# Patient Record
Sex: Female | Born: 1980 | Race: White | Hispanic: No | Marital: Married | Smoking: Never smoker
Health system: Southern US, Community
[De-identification: ages and names within clinical notes are randomized; demographics above are authoritative.]

## PROBLEM LIST (undated history)

## (undated) DIAGNOSIS — J4540 Moderate persistent asthma, uncomplicated: Secondary | ICD-10-CM

## (undated) DIAGNOSIS — J45909 Unspecified asthma, uncomplicated: Secondary | ICD-10-CM

## (undated) DIAGNOSIS — N920 Excessive and frequent menstruation with regular cycle: Secondary | ICD-10-CM

## (undated) DIAGNOSIS — I10 Essential (primary) hypertension: Secondary | ICD-10-CM

## (undated) DIAGNOSIS — Z8759 Personal history of other complications of pregnancy, childbirth and the puerperium: Secondary | ICD-10-CM

## (undated) DIAGNOSIS — R7303 Prediabetes: Secondary | ICD-10-CM

## (undated) DIAGNOSIS — O021 Missed abortion: Secondary | ICD-10-CM

## (undated) HISTORY — PX: WISDOM TOOTH EXTRACTION: SHX21

---

## 1999-01-02 ENCOUNTER — Emergency Department (HOSPITAL_COMMUNITY): Admission: EM | Admit: 1999-01-02 | Discharge: 1999-01-02 | Payer: Self-pay | Admitting: Emergency Medicine

## 1999-01-13 ENCOUNTER — Emergency Department (HOSPITAL_COMMUNITY): Admission: EM | Admit: 1999-01-13 | Discharge: 1999-01-13 | Payer: Self-pay | Admitting: Emergency Medicine

## 2003-04-08 ENCOUNTER — Other Ambulatory Visit: Admission: RE | Admit: 2003-04-08 | Discharge: 2003-04-08 | Payer: Self-pay | Admitting: Obstetrics and Gynecology

## 2004-05-11 ENCOUNTER — Other Ambulatory Visit: Admission: RE | Admit: 2004-05-11 | Discharge: 2004-05-11 | Payer: Self-pay | Admitting: Obstetrics and Gynecology

## 2005-05-15 ENCOUNTER — Other Ambulatory Visit: Admission: RE | Admit: 2005-05-15 | Discharge: 2005-05-15 | Payer: Self-pay | Admitting: Obstetrics and Gynecology

## 2008-03-08 ENCOUNTER — Ambulatory Visit (HOSPITAL_COMMUNITY): Admission: RE | Admit: 2008-03-08 | Discharge: 2008-03-08 | Payer: Self-pay | Admitting: Obstetrics and Gynecology

## 2008-05-21 ENCOUNTER — Emergency Department: Payer: Self-pay | Admitting: Emergency Medicine

## 2008-05-22 ENCOUNTER — Inpatient Hospital Stay (HOSPITAL_COMMUNITY): Admission: AD | Admit: 2008-05-22 | Discharge: 2008-05-25 | Payer: Self-pay | Admitting: Obstetrics and Gynecology

## 2008-05-25 ENCOUNTER — Encounter (INDEPENDENT_AMBULATORY_CARE_PROVIDER_SITE_OTHER): Payer: Self-pay | Admitting: Obstetrics and Gynecology

## 2009-08-08 IMAGING — US US AMNIOCENTESIS
1 series · 8 of 8 positions shown · non-contrast
Comparison: none

OBSTETRICAL ULTRASOUND:
 This ultrasound was performed in The [HOSPITAL], and the [facility] report will be stored to [REDACTED] PACS.

[Series 1: us amniocentesis · 8 of 8 slices shown]
[im 1/8]
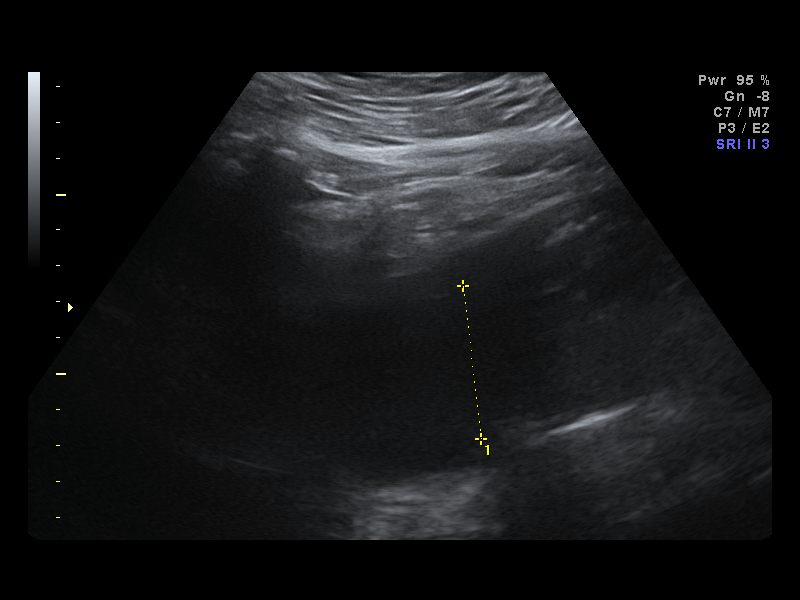
[im 2/8]
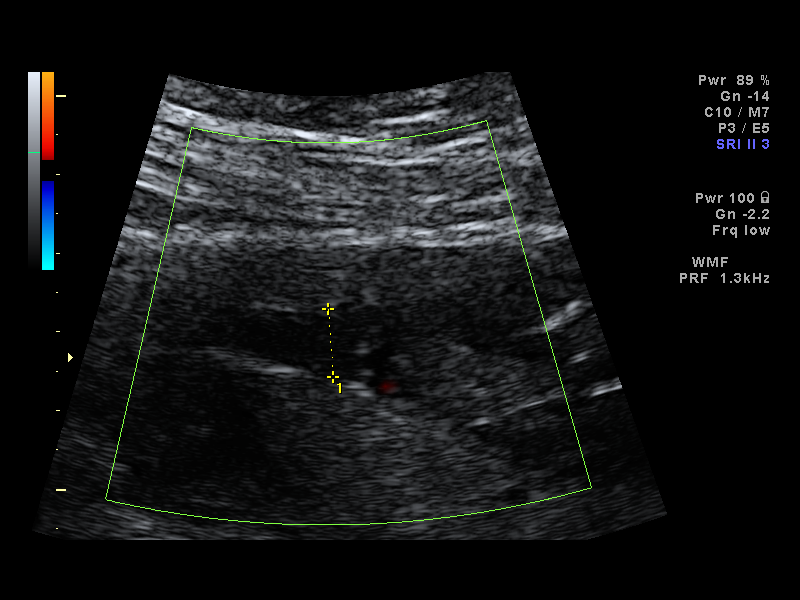
[im 3/8]
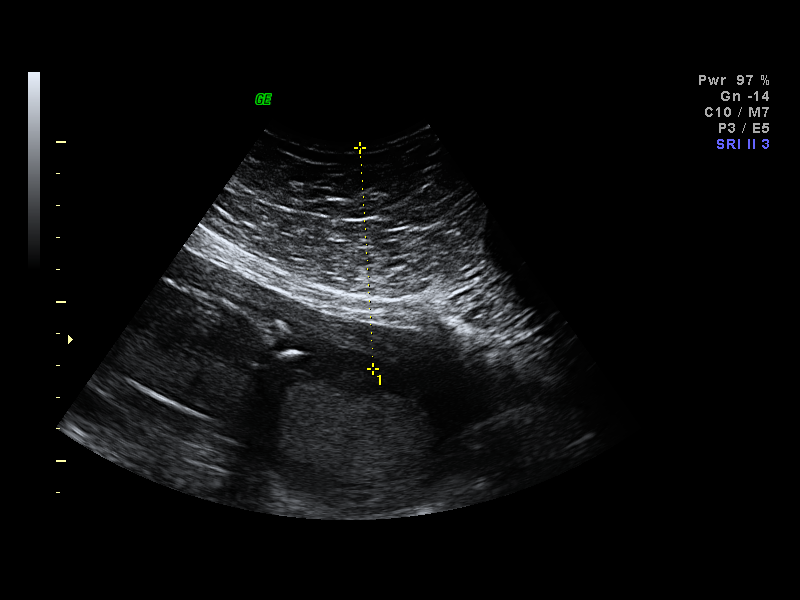
[im 4/8]
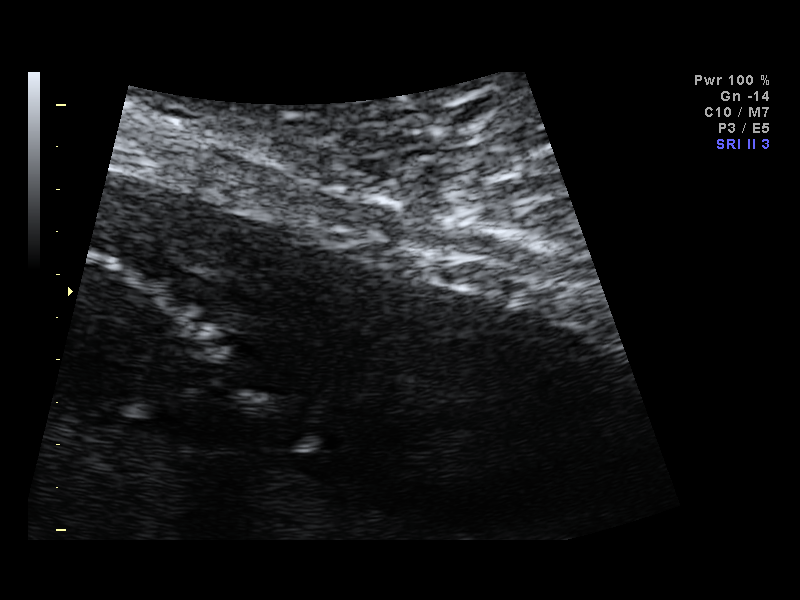
[im 5/8]
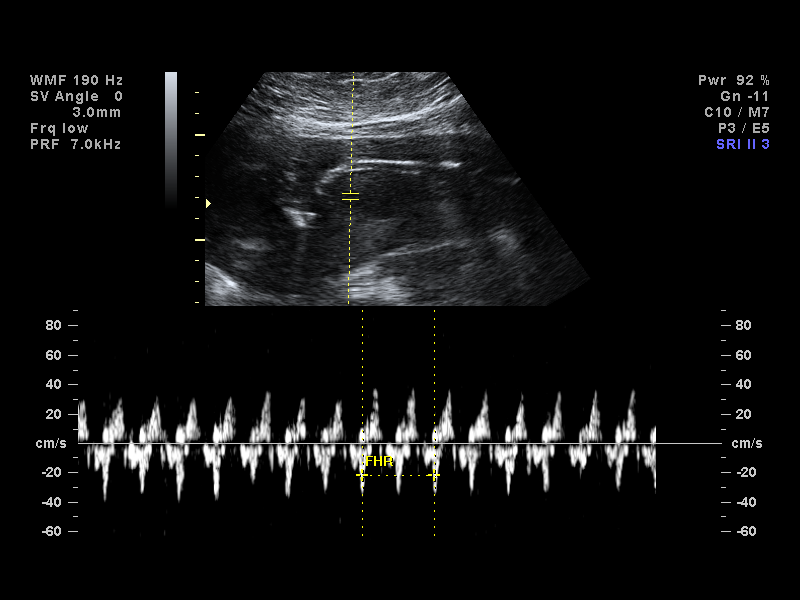
[im 6/8]
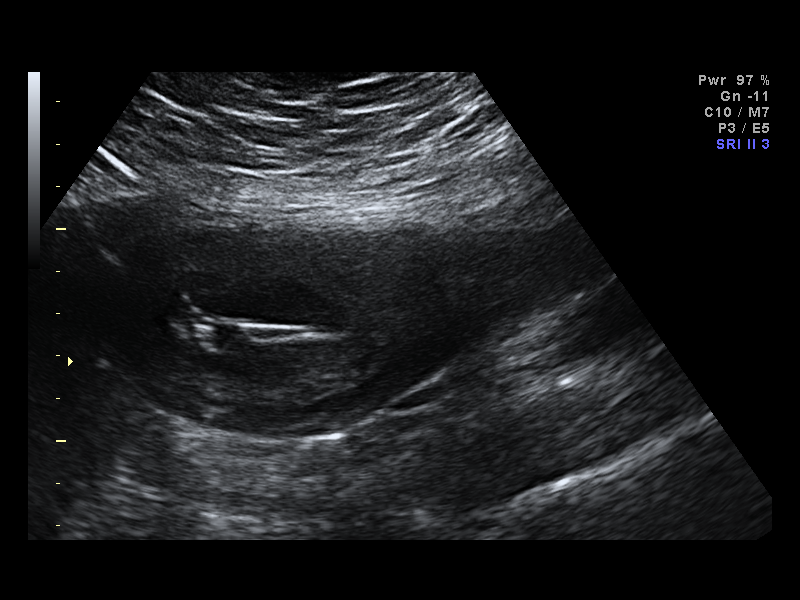
[im 7/8]
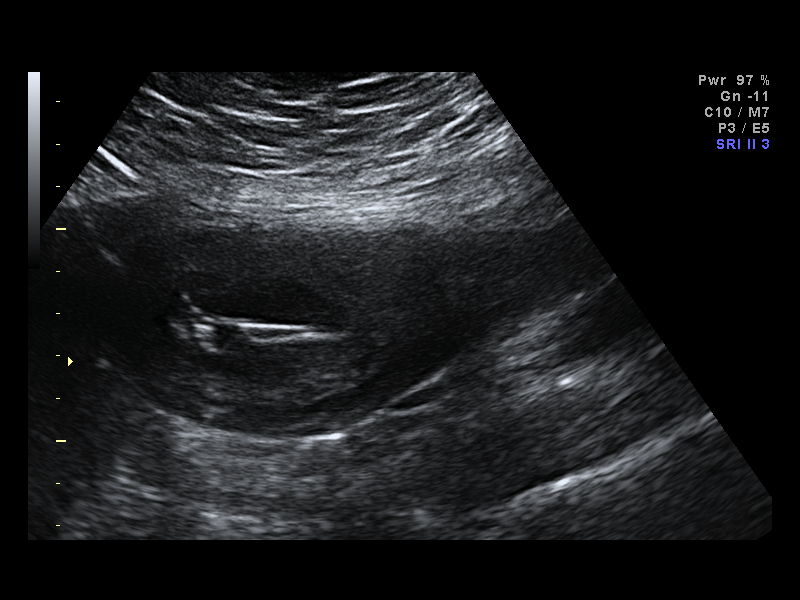
[im 8/8]
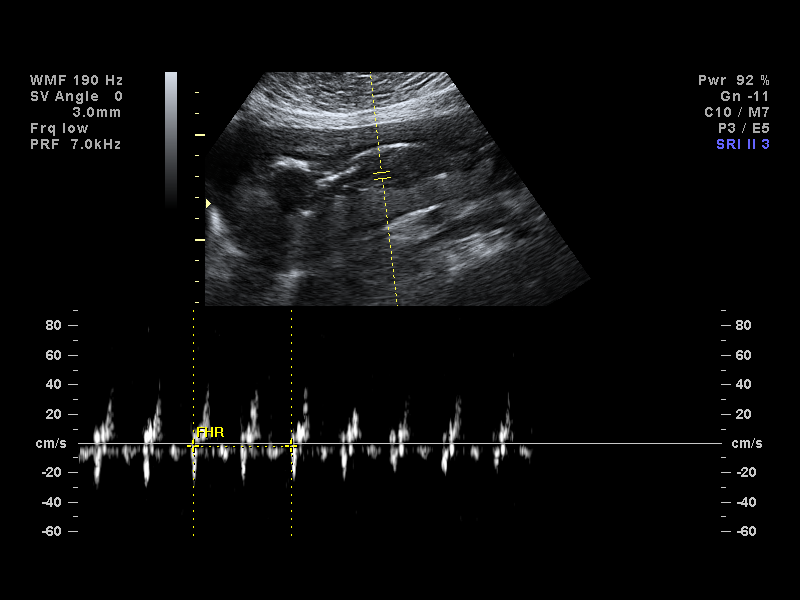

[8 of 8 positions shown; findings below may reference images not displayed]

IMPRESSION: [facility] has also been faxed to the ordering physician.

## 2009-09-08 ENCOUNTER — Inpatient Hospital Stay (HOSPITAL_COMMUNITY): Admission: AD | Admit: 2009-09-08 | Discharge: 2009-09-08 | Payer: Self-pay | Admitting: Obstetrics and Gynecology

## 2009-11-26 HISTORY — PX: CERVICAL CERCLAGE: SHX1329

## 2010-04-04 ENCOUNTER — Ambulatory Visit (HOSPITAL_COMMUNITY): Admission: RE | Admit: 2010-04-04 | Discharge: 2010-04-05 | Payer: Self-pay | Admitting: Obstetrics and Gynecology

## 2010-04-04 HISTORY — PX: CERVICAL CERCLAGE: SHX1329

## 2010-05-15 ENCOUNTER — Ambulatory Visit (HOSPITAL_COMMUNITY): Admission: RE | Admit: 2010-05-15 | Discharge: 2010-05-15 | Payer: Self-pay | Admitting: Obstetrics and Gynecology

## 2010-06-05 ENCOUNTER — Ambulatory Visit (HOSPITAL_COMMUNITY): Admission: RE | Admit: 2010-06-05 | Discharge: 2010-06-05 | Payer: Self-pay | Admitting: Obstetrics and Gynecology

## 2010-07-03 ENCOUNTER — Inpatient Hospital Stay (HOSPITAL_COMMUNITY): Admission: AD | Admit: 2010-07-03 | Discharge: 2010-07-03 | Payer: Self-pay | Admitting: Obstetrics & Gynecology

## 2010-07-04 ENCOUNTER — Inpatient Hospital Stay (HOSPITAL_COMMUNITY): Admission: AD | Admit: 2010-07-04 | Discharge: 2010-07-04 | Payer: Self-pay | Admitting: Obstetrics and Gynecology

## 2010-09-14 ENCOUNTER — Inpatient Hospital Stay (HOSPITAL_COMMUNITY): Admission: AD | Admit: 2010-09-14 | Discharge: 2010-09-14 | Payer: Self-pay | Admitting: Obstetrics and Gynecology

## 2010-09-18 ENCOUNTER — Observation Stay (HOSPITAL_COMMUNITY): Admission: AD | Admit: 2010-09-18 | Discharge: 2010-09-20 | Payer: Self-pay | Admitting: Obstetrics and Gynecology

## 2010-09-19 ENCOUNTER — Ambulatory Visit (HOSPITAL_COMMUNITY): Admission: RE | Admit: 2010-09-19 | Discharge: 2010-09-19 | Payer: Self-pay | Admitting: Obstetrics and Gynecology

## 2010-09-25 ENCOUNTER — Encounter (INDEPENDENT_AMBULATORY_CARE_PROVIDER_SITE_OTHER): Payer: Self-pay | Admitting: Obstetrics and Gynecology

## 2010-09-25 ENCOUNTER — Inpatient Hospital Stay (HOSPITAL_COMMUNITY): Admission: RE | Admit: 2010-09-25 | Discharge: 2010-09-28 | Payer: Self-pay | Admitting: Obstetrics and Gynecology

## 2010-09-28 ENCOUNTER — Encounter: Admission: RE | Admit: 2010-09-28 | Discharge: 2010-10-06 | Payer: Self-pay | Admitting: Obstetrics and Gynecology

## 2011-02-06 LAB — CBC
HCT: 30.1 % — ABNORMAL LOW (ref 36.0–46.0)
Hemoglobin: 10.4 g/dL — ABNORMAL LOW (ref 12.0–15.0)
MCH: 30.9 pg (ref 26.0–34.0)
MCHC: 34.5 g/dL (ref 30.0–36.0)
MCV: 89.8 fL (ref 78.0–100.0)
Platelets: 248 10*3/uL (ref 150–400)
RBC: 3.35 MIL/uL — ABNORMAL LOW (ref 3.87–5.11)
RDW: 15.5 % (ref 11.5–15.5)
WBC: 12.5 10*3/uL — ABNORMAL HIGH (ref 4.0–10.5)

## 2011-02-07 LAB — URIC ACID
Uric Acid, Serum: 4.7 mg/dL (ref 2.4–7.0)
Uric Acid, Serum: 4.8 mg/dL (ref 2.4–7.0)
Uric Acid, Serum: 5 mg/dL (ref 2.4–7.0)

## 2011-02-07 LAB — URINALYSIS, MICROSCOPIC ONLY
Glucose, UA: NEGATIVE mg/dL
Specific Gravity, Urine: 1.01 (ref 1.005–1.030)
pH: 7 (ref 5.0–8.0)

## 2011-02-07 LAB — URINALYSIS, ROUTINE W REFLEX MICROSCOPIC
Nitrite: NEGATIVE
Specific Gravity, Urine: 1.02 (ref 1.005–1.030)
Urobilinogen, UA: 0.2 mg/dL (ref 0.0–1.0)

## 2011-02-07 LAB — COMPREHENSIVE METABOLIC PANEL
AST: 24 U/L (ref 0–37)
Albumin: 2.7 g/dL — ABNORMAL LOW (ref 3.5–5.2)
Albumin: 2.9 g/dL — ABNORMAL LOW (ref 3.5–5.2)
Alkaline Phosphatase: 89 U/L (ref 39–117)
Alkaline Phosphatase: 99 U/L (ref 39–117)
BUN: 1 mg/dL — ABNORMAL LOW (ref 6–23)
BUN: 3 mg/dL — ABNORMAL LOW (ref 6–23)
BUN: 4 mg/dL — ABNORMAL LOW (ref 6–23)
CO2: 23 mEq/L (ref 19–32)
Calcium: 9.4 mg/dL (ref 8.4–10.5)
Chloride: 101 mEq/L (ref 96–112)
Chloride: 104 mEq/L (ref 96–112)
Creatinine, Ser: 0.43 mg/dL (ref 0.4–1.2)
GFR calc Af Amer: >60 mL/min (ref >60)
GFR calc non Af Amer: >60 mL/min (ref >60)
Potassium: 3.7 mEq/L (ref 3.5–5.1)
Potassium: 3.9 mEq/L (ref 3.5–5.1)
Sodium: 133 mEq/L — ABNORMAL LOW (ref 135–145)
Total Bilirubin: 0.3 mg/dL (ref 0.3–1.2)
Total Bilirubin: 0.4 mg/dL (ref 0.3–1.2)
Total Protein: 6.1 g/dL (ref 6.0–8.3)

## 2011-02-07 LAB — CBC
Hemoglobin: 11.9 g/dL — ABNORMAL LOW (ref 12.0–15.0)
MCH: 30.4 pg (ref 26.0–34.0)
MCV: 89.1 fL (ref 78.0–100.0)
MCV: 89.3 fL (ref 78.0–100.0)
MCV: 89.6 fL (ref 78.0–100.0)
Platelets: 289 10*3/uL (ref 150–400)
Platelets: 317 10*3/uL (ref 150–400)
RBC: 3.93 MIL/uL (ref 3.87–5.11)
RBC: 4.11 MIL/uL (ref 3.87–5.11)
RDW: 15.3 % (ref 11.5–15.5)
WBC: 10.2 10*3/uL (ref 4.0–10.5)
WBC: 11.7 10*3/uL — ABNORMAL HIGH (ref 4.0–10.5)

## 2011-02-07 LAB — SYPHILIS: RPR W/REFLEX TO RPR TITER AND TREPONEMAL ANTIBODIES, TRADITIONAL SCREENING AND DIAGNOSIS ALGORITHM: RPR Ser Ql: NONREACTIVE

## 2011-02-07 LAB — URINE MICROSCOPIC-ADD ON

## 2011-02-13 LAB — CBC
HCT: 34.2 % — ABNORMAL LOW (ref 36.0–46.0)
Hemoglobin: 11.8 g/dL — ABNORMAL LOW (ref 12.0–15.0)
Platelets: 305 10*3/uL (ref 150–400)
RBC: 3.78 MIL/uL — ABNORMAL LOW (ref 3.87–5.11)
RDW: 13.2 % (ref 11.5–15.5)
RDW: 13.4 % (ref 11.5–15.5)

## 2011-02-13 LAB — URINALYSIS, ROUTINE W REFLEX MICROSCOPIC
Bilirubin Urine: NEGATIVE
Nitrite: NEGATIVE
Protein, ur: NEGATIVE mg/dL
Specific Gravity, Urine: 1.01 (ref 1.005–1.030)
Urobilinogen, UA: 0.2 mg/dL (ref 0.0–1.0)

## 2011-02-13 LAB — TYPE AND SCREEN

## 2011-02-13 LAB — URINE MICROSCOPIC-ADD ON

## 2011-03-01 LAB — CBC
Hemoglobin: 12.6 g/dL (ref 12.0–15.0)
MCHC: 33.7 g/dL (ref 30.0–36.0)
MCV: 92.9 fL (ref 78.0–100.0)
RDW: 13.4 % (ref 11.5–15.5)

## 2011-04-10 NOTE — Consult Note (Signed)
NAME:  Summer Thomas, Summer Thomas             ACCOUNT NO.:  [id number]   MEDICAL RECORD NO.:  [id number]          PATIENT TYPE:  INP   LOCATION:  9373                          FACILITY:  [facility]   PHYSICIAN:  Toma Copier, MD           DATE OF BIRTH:  [birth year]   DATE OF CONSULTATION:  DATE OF DISCHARGE:                                 CONSULTATION   CONSULTATION REQUESTED BY:  Randye Lobo, M.D.   REASON FOR CONSULTATION:  Evaluation and recommendations regarding  cervical insufficiency.   The recommendations are already placed on the chart, but this is a more  detailed consultation report.  The patient was evaluated on May 22, 2008 at 4 p.m.   CLINICAL PRESENTATION:  The patient is a 30 year old G 1, P 0 at 16  weeks and 4 days who was admitted to [facility] at [facility]  secondary to prolapsing membranes into the cervix and vagina.  After  transfer and evaluation by Dr. Edward Jolly revealed that there are amniotic  membranes into the vagina, but are difficult to reduce further, and  there are no palpable fetal parts.  This finding in conjunction with the  ultrasound findings that reveal no obvious cervix and a cervical funnel  width of 4.7 cm with membranes funneling into the vagina are consistent.  The ultrasound does confirm an estimated fetal weight of 16 weeks and 4  days and a normal fetal heart rate.   The patient reports that this presentation occurred the prior evening  and was feeling pressure as if to have a bowel movement.  The patient  also denies any cramping, pain or leakage.  No vaginal bleeding or any  abnormal discharge.  Denies any fevers, chills, dysuria or any recent  illnesses.  She also denies any surgical procedures to the cervix and  has received prenatal care.  Since her hospitalization, there have been  no changes to any of these reported symptoms.   PAST MEDICAL HISTORY:  Notable for wisdom teeth extractions.   ALLERGIES:  INCLUDE PENICILLIN, WHICH CAUSES  HIVES.   SOCIAL HISTORY:  She denies any alcohol, tobacco or drugs of abuse.   OBSTETRICAL HISTORY:  She is a G 1.   GYNECOLOGICAL HISTORY:  Denies any abnormal Paps or STDs.   PHYSICAL EXAMINATION:  Reveals that she is afebrile.  VITAL SIGNS:  Stable.  HEART RATE:  Fetal heart tones are documented in the 160s as mentioned  previously.  GENERAL:  She is alert and oriented and concerned, but in no acute  distress.  RESPIRATORY EXAM:  Clear to auscultation bilaterally.  CARDIOVASCULAR EXAM:  Reveals a regular rate and rhythm with no notable  murmurs  ABDOMEN:  Reveals a gravid, nontender uterus.  EXTREMITIES:  No clubbing or cyanosis.  PELVIC:  Exam deferred as it was already previously performed by Dr.  Edward Jolly and based on her speculum examination, membranes were protruding  to the level of the hymen, there was no evidence of any fetal parts nor  any opaque amniotic fluid, and the membranes were somewhat reducible but  unable  to evaluate the cervix.   LABORATORY EVALUATION:  Reveals a hemoglobin of 12.4, a white count of  10.4 and a platelet count of 297,000.  All other laboratory evaluations  appear to be within normal limits.  As mentioned, the ultrasound finding  reveals 15 week and 4 day intrauterine pregnancy, breech presentation at  173 grams and the cervical dilatation of 4.7 cm.   ASSESSMENT:  A 15 week and 4 day intrauterine pregnancy with cervical  insufficiency and advanced cervical dilatation.  There is no clinical  evidence of chorionitis at this time and the patient is currently  receiving Clindamycin therapy.  She is in Trendelenburg position at the  time of evaluation.   PLAN:  The consultation included an extensive discussion regarding this  clinical picture.  We discussed three options:  1. Expectant management.  2. Heroic cervical cerclage procedure.  3. Elective termination of pregnancy.   I reviewed two recent studies, one published in the American  Journal of  Obstetrics and Gynecology by Dr. Alan Mulder in which similar presentations  were reviewed, women presenting with dilated cervix from 14 to 26 weeks'  gestation, and in this study, they had over 152 patients that were  analyzed with a cerclage placement compared to 86 women in the expectant  management group.  However, only 3 patients had cervix that were dilated  to nearly 5 cm.  Three to four patients had cervix that were dilated to  nearly 5 cm, and of note, the interval from presentation to delivery in  all cases of cerclage was 1.8 weeks with the 95% confidence interval  being 2.45 weeks, and those women who were dilated 2 to 4 cm, their  interval from presentation to delivery was 1.3 cm with the upper limit  of the confidence interval being 2.3 weeks.  Further discussion in their  publication revealed that in most women when a cervix is dilated greater  than 4 cm, the cerclage may no longer be a viable option.  This was  consistent with the findings published in the European Journal of  Obstetrics and Gynecology by [employer], in which their series of 161  patients evaluated form 1989 to 2005 with second trimester cerclages,  had findings where the average amount of interval from presentation to  delivery was 41 days in the cerclage group and 3 days in the expectant  management group.  In the [place] studies group, the average birth weight  was 1340 grams versus 750 grams.  None of these studies addressed the  neonatal morbidities associated with these preterm births, but only  focused on live birth findings.  I did offer to provide one of these  studies for the patient to keep and review, but she declined.   Additionally, we discussed the likelihood of an intraamniotic infection  ranging anywhere from 10 to up to 50% in the setting when the cervix is  dilated greater than 2 cm.  Also discussed that coronitis occurs in over  25% when there is a nonelective cerclage and the  risk for an iatrogenic  rupture of membranes is over 50% in these settings with advanced  cervical dilatations.   The alternatives that we offered th patient again were an elective  induction, which was declined.  Next option was evaluating for  intraamniotic infection via amniocentesis with the associated risk of  rupture of membranes and the challenge given the funneling of the  membrane, and this was also declined.  Cervical cerclage procedure,  given the above mentioned discussion, was deferred at this time by the  patient, and expected management with some IV antibiotics was elected as  the option to proceed with at this time.   I was available to address all their questions related to each of these  options, and reassured them that any decision they elected at this time  would be based on a personal choice, but we would be glad to assist in  any or all of these options.  With regards to future pregnancies  irrespective of current pregnancy outcome, I recommended that she would  need a prophylactic cerclage placed at 13 to 14 weeks' gestation given  this presentation.   A total of 1 hour was spent on this consultation of which 45 minutes  were face to face time.  The patient and her husband felt very satisfied  with our discussion and the options available to them, and were  comfortable with their decision to continue expected management.   Thank you very much for allowing me to participate in the care of Mrs.  Lezama, and if you have any further questions or if plans change,  please contact me and I will be glad to assist.      Toma Copier, MD  Electronically Signed     SJ/MEDQ  D:  05/23/2008  T:  05/23/2008  Job:  [id number]

## 2011-04-13 NOTE — Discharge Summary (Signed)
[name], [name]             ACCOUNT NO.:  [id number]   MEDICAL RECORD NO.:  [id number]          PATIENT TYPE:  INP   LOCATION:  9373                          [facility]ACILITY:  [facility]   PHYSICIAN:  Carrington Clamp, M.D. DATE OF BIRTH:  [birth year]   DATE OF ADMISSION:  05/22/2008  DATE OF DISCHARGE:  05/25/2008                               DISCHARGE SUMMARY   FINAL DIAGNOSES:  1. Intrauterine pregnancy at 16-4/7th weeks' gestation.  2. 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  3. Advanced dilation.  4. Spontaneous rupture of membranes.   Procedure and delivery performed by Dr. Carrington Clamp.   COMPLICATIONS:  None.   This 30 year old G1, P0 presents at 16-4/7th weeks' gestation on to the  [facility], arriving from [facility] with prolapsing of  membranes into the vagina.  The patient was evaluated by Dr. Edward Jolly and  the amniotic membranes were felt difficult to reduce further.  The  patient also had some advanced cervical dilation.  Dr. Ander Slade, maternal  fetal medicine specialist, was consulted on the patient.  The cervix was  already about 4.7.  The patient's cervix was already having some  funneling.  Discussion was held with the patient regarding expectant  management versus rescue cervical cerclage.  The patient decided to have  amniocentesis performed to check status of pregnancy for karyotyping.  Unfortunately, the patient was also started on antibiotics during this  time.  On May 23, 2008, the patient did have amniocentesis, but on May 24, 2008, spontaneous rupture of membranes occurred.  The patient  underwent induction for the spontaneous rupture.  She received Cytotec  and Stadol.  Delivery was performed by Carrington Clamp, M.D.,  unsure  if the patient decided on chromosomal studies on the baby.  The patient  was felt ready for discharge on May 25, 2008.  She was sent home on a  regular diet.  Told to decrease her activities.  She  was given Motrin  800 mg every 8 hours as needed for pain, told she could use Ambien 10 mg  as needed to help with sleep.  Precautions were reviewed with the  patient.   LABORATORY DATA:  On discharge, the patient had a hemoglobin of 12.3,  white blood cell count of 13.4, and platelets of 264,000.  Group B strep  was negative.  Gonorrhea and chlamydia were negative.      Summer Thomas, P.A.-C.      Carrington Clamp, M.D.  Electronically Signed    MB/MEDQ  D:  06/23/2008  T:  06/24/2008  Job:  [id number]

## 2011-08-23 LAB — MISCELLANEOUS TEST

## 2011-08-23 LAB — CBC
HCT: 35.2 — ABNORMAL LOW
HCT: 35.7 — ABNORMAL LOW
HCT: 36
Hemoglobin: 12.1
Hemoglobin: 12.3
MCHC: 34.8
MCV: 91.6
MCV: 92.5
Platelets: 249
Platelets: 264
Platelets: 297
RBC: 3.91
RDW: 12.9
RDW: 13.3
WBC: 10.4
WBC: 10.9 — ABNORMAL HIGH
WBC: 13.4 — ABNORMAL HIGH

## 2011-08-23 LAB — GLUCOSE, SEROUS FLUID: Glucose, Fluid: 10

## 2011-08-23 LAB — DIFFERENTIAL
Basophils Absolute: 0
Basophils Absolute: 0
Eosinophils Absolute: 0
Eosinophils Relative: 0
Eosinophils Relative: 0
Eosinophils Relative: 1
Eosinophils Relative: 1
Lymphocytes Relative: 10 — ABNORMAL LOW
Lymphocytes Relative: 15
Lymphocytes Relative: 16
Lymphocytes Relative: 19
Lymphs Abs: 1.1
Lymphs Abs: 2
Lymphs Abs: 2.1
Monocytes Absolute: 0.4
Monocytes Absolute: 0.8
Neutro Abs: 9 — ABNORMAL HIGH
Neutro Abs: 9.4 — ABNORMAL HIGH
Neutrophils Relative %: 76

## 2011-08-23 LAB — URINALYSIS, ROUTINE W REFLEX MICROSCOPIC
Bilirubin Urine: NEGATIVE
Glucose, UA: NEGATIVE
Ketones, ur: 15 — AB
Nitrite: NEGATIVE
Specific Gravity, Urine: 1.01
pH: 7

## 2011-08-23 LAB — COMPREHENSIVE METABOLIC PANEL
AST: 16
Albumin: 2.7 — ABNORMAL LOW
BUN: 4 — ABNORMAL LOW
Calcium: 8.9
Creatinine, Ser: 0.39 — ABNORMAL LOW
GFR calc Af Amer: >60
Total Protein: 6.3

## 2011-08-23 LAB — CULTURE, BETA STREP (GROUP B ONLY)

## 2011-08-23 LAB — BODY FLUID CULTURE

## 2011-08-23 LAB — ABO/RH: ABO/RH(D): A POS

## 2011-08-23 LAB — TYPE AND SCREEN: ABO/RH(D): A POS

## 2011-08-23 LAB — WET PREP, GENITAL
Trich, Wet Prep: NONE SEEN
Yeast Wet Prep HPF POC: NONE SEEN

## 2011-08-23 LAB — GC/CHLAMYDIA PROBE AMP, GENITAL: Chlamydia, DNA Probe: NEGATIVE

## 2014-03-05 ENCOUNTER — Other Ambulatory Visit: Payer: Self-pay | Admitting: Obstetrics and Gynecology

## 2014-06-25 ENCOUNTER — Other Ambulatory Visit: Payer: Self-pay | Admitting: Obstetrics and Gynecology

## 2014-07-08 LAB — OB RESULTS CONSOLE RPR: RPR: NONREACTIVE

## 2014-07-08 LAB — OB RESULTS CONSOLE HEPATITIS B SURFACE ANTIGEN: Hepatitis B Surface Ag: NEGATIVE

## 2014-07-08 LAB — OB RESULTS CONSOLE ABO/RH: RH TYPE: POSITIVE

## 2014-07-08 LAB — OB RESULTS CONSOLE HIV ANTIBODY (ROUTINE TESTING): HIV: NONREACTIVE

## 2014-07-08 LAB — OB RESULTS CONSOLE ANTIBODY SCREEN: Antibody Screen: NEGATIVE

## 2014-07-08 LAB — OB RESULTS CONSOLE RUBELLA ANTIBODY, IGM: Rubella: IMMUNE

## 2014-08-03 ENCOUNTER — Encounter (HOSPITAL_COMMUNITY): Payer: Self-pay | Admitting: *Deleted

## 2014-08-03 ENCOUNTER — Other Ambulatory Visit (HOSPITAL_COMMUNITY): Payer: Self-pay | Admitting: Obstetrics and Gynecology

## 2014-08-03 DIAGNOSIS — N883 Incompetence of cervix uteri: Secondary | ICD-10-CM

## 2014-08-04 ENCOUNTER — Encounter (HOSPITAL_COMMUNITY): Payer: Self-pay | Admitting: Pharmacist

## 2014-08-13 ENCOUNTER — Ambulatory Visit (HOSPITAL_COMMUNITY)
Admission: RE | Admit: 2014-08-13 | Discharge: 2014-08-13 | Disposition: A | Discharge status: Home / Self Care | Payer: BC Managed Care – PPO | Source: Ambulatory Visit | Attending: Obstetrics and Gynecology | Admitting: Obstetrics and Gynecology

## 2014-08-13 DIAGNOSIS — N883 Incompetence of cervix uteri: Secondary | ICD-10-CM

## 2014-08-13 DIAGNOSIS — O3430 Maternal care for cervical incompetence, unspecified trimester: Secondary | ICD-10-CM | POA: Diagnosis not present

## 2014-08-15 ENCOUNTER — Other Ambulatory Visit: Payer: Self-pay | Admitting: Obstetrics and Gynecology

## 2014-08-15 NOTE — H&P (Signed)
33 y.o. yo G3P1 at 37 4/7 weeks with history of pregnacy loss at 16 weeks and successful pregnancy after cervical cerclage in last pregnancy despite funnelling of cervix. On Friday pt had [place] that showed 3.5 cm cervix with NO funnelling.  Pt had declined testing for downs for AMA.  Past Medical History  Diagnosis Date  . Hypertension 2011-2013    no meds currently   Past Surgical History  Procedure Laterality Date  . Cervical cerclage    . Cesarean section      History   Social History  . Marital Status: Married    Spouse Name: N/A    Number of Children: N/A  . Years of Education: N/A   Occupational History  . Not on file.   Social History Main Topics  . Smoking status: Never Smoker   . Smokeless tobacco: Not on file  . Alcohol Use: No  . Drug Use: No  . Sexual Activity: Not on file   Other Topics Concern  . Not on file   Social History Narrative  . No narrative on file    No current facility-administered medications on file prior to encounter.   No current outpatient prescriptions on file prior to encounter.    Allergies  Allergen Reactions  . Penicillins Rash    Childhood rxn    @  Lungs: clear to ascultation Cor:  RRR Abdomen:  soft, nontender, nondistended. Ex:  no cords, erythema Pelvic:  NEFG, cervix closed and no abnormalites.  A:  Third pregnancy with hx of incompetent cerclage; now 13 4/7 weeks.   P:  For transvaginal cerclage- [place] planned with 2 stitches.   All risks, benefits and alternatives d/w patient and she desires to proceed.  Patient will receive preop antibiotics and SCDs during the operation.     Reshunda Strider A

## 2014-08-16 ENCOUNTER — Ambulatory Visit (HOSPITAL_COMMUNITY): Payer: BC Managed Care – PPO | Admitting: Anesthesiology

## 2014-08-16 ENCOUNTER — Ambulatory Visit (HOSPITAL_COMMUNITY): Payer: BC Managed Care – PPO

## 2014-08-16 ENCOUNTER — Encounter (HOSPITAL_COMMUNITY)
Admission: RE | Disposition: A | Discharge status: Home / Self Care | Payer: Self-pay | Source: Ambulatory Visit | Attending: Obstetrics and Gynecology

## 2014-08-16 ENCOUNTER — Ambulatory Visit (HOSPITAL_COMMUNITY)
Admission: RE | Admit: 2014-08-16 | Discharge: 2014-08-16 | Disposition: A | Discharge status: Home / Self Care | Payer: BC Managed Care – PPO | Source: Ambulatory Visit | Attending: Obstetrics and Gynecology | Admitting: Obstetrics and Gynecology

## 2014-08-16 ENCOUNTER — Encounter (HOSPITAL_COMMUNITY): Payer: Self-pay | Admitting: Certified Registered"

## 2014-08-16 ENCOUNTER — Encounter (HOSPITAL_COMMUNITY): Payer: BC Managed Care – PPO | Admitting: Anesthesiology

## 2014-08-16 DIAGNOSIS — Z6836 Body mass index (BMI) 36.0-36.9, adult: Secondary | ICD-10-CM | POA: Insufficient documentation

## 2014-08-16 DIAGNOSIS — O10019 Pre-existing essential hypertension complicating pregnancy, unspecified trimester: Secondary | ICD-10-CM | POA: Insufficient documentation

## 2014-08-16 DIAGNOSIS — O3430 Maternal care for cervical incompetence, unspecified trimester: Secondary | ICD-10-CM | POA: Diagnosis present

## 2014-08-16 DIAGNOSIS — N883 Incompetence of cervix uteri: Secondary | ICD-10-CM

## 2014-08-16 HISTORY — PX: CERVICAL CERCLAGE: SHX1329

## 2014-08-16 HISTORY — DX: Essential (primary) hypertension: I10

## 2014-08-16 LAB — CBC
HEMATOCRIT: 38.7 % (ref 36.0–46.0)
HEMOGLOBIN: 13.4 g/dL (ref 12.0–15.0)
MCH: 30.9 pg (ref 26.0–34.0)
MCHC: 34.6 g/dL (ref 30.0–36.0)
MCV: 89.2 fL (ref 78.0–100.0)
Platelets: 285 10*3/uL (ref 150–400)
RBC: 4.34 MIL/uL (ref 3.87–5.11)
RDW: 13.5 % (ref 11.5–15.5)
WBC: 11.5 10*3/uL — ABNORMAL HIGH (ref 4.0–10.5)

## 2014-08-16 SURGERY — CERCLAGE, CERVIX, VAGINAL APPROACH
Anesthesia: General | Site: Vagina

## 2014-08-16 MED ORDER — LACTATED RINGERS IV SOLN
INTRAVENOUS | Status: DC
  Administered 2014-08-16: 10:00:00 via INTRAVENOUS

## 2014-08-16 MED ORDER — METRONIDAZOLE IN NACL 5-0.79 MG/ML-% IV SOLN
500.0000 mg | INTRAVENOUS | Status: AC
  Administered 2014-08-16: 1 g via INTRAVENOUS
  Filled 2014-08-16: qty 100

## 2014-08-16 MED ORDER — HYDROMORPHONE HCL 1 MG/ML IJ SOLN
0.2500 mg | INTRAMUSCULAR | Status: DC | PRN
  Administered 2014-08-16: 0.5 mg via INTRAVENOUS

## 2014-08-16 MED ORDER — HYDROMORPHONE HCL 1 MG/ML IJ SOLN
INTRAMUSCULAR | Status: AC
  Administered 2014-08-16: 0.5 mg via INTRAVENOUS
  Filled 2014-08-16: qty 1

## 2014-08-16 MED ORDER — GENTAMICIN SULFATE 40 MG/ML IJ SOLN
5.0000 mg/kg | INTRAVENOUS | Status: AC
  Administered 2014-08-16: 100 mg via INTRAVENOUS
  Filled 2014-08-16: qty 9.5

## 2014-08-16 MED ORDER — PHENYLEPHRINE HCL 10 MG/ML IJ SOLN
INTRAMUSCULAR | Status: DC | PRN
  Administered 2014-08-16: 40 ug via INTRAVENOUS
  Administered 2014-08-16 (×2): 80 ug via INTRAVENOUS

## 2014-08-16 MED ORDER — SCOPOLAMINE 1 MG/3DAYS TD PT72
MEDICATED_PATCH | TRANSDERMAL | Status: AC
  Filled 2014-08-16: qty 1

## 2014-08-16 MED ORDER — LACTATED RINGERS IV SOLN
INTRAVENOUS | Status: DC | PRN
  Administered 2014-08-16 (×2): via INTRAVENOUS

## 2014-08-16 MED ORDER — PHENYLEPHRINE HCL 10 MG/ML IJ SOLN
INTRAMUSCULAR | Status: AC
  Filled 2014-08-16: qty 2

## 2014-08-16 MED ORDER — SCOPOLAMINE 1 MG/3DAYS TD PT72
1.0000 | MEDICATED_PATCH | Freq: Once | TRANSDERMAL | Status: DC
  Administered 2014-08-16: 1.5 mg via TRANSDERMAL

## 2014-08-16 MED ORDER — LIDOCAINE IN DEXTROSE 5-7.5 % IV SOLN
INTRAVENOUS | Status: AC
  Filled 2014-08-16: qty 2

## 2014-08-16 SURGICAL SUPPLY — 22 items
CATH FOLEY 2WAY SLVR 30CC 16FR (CATHETERS) IMPLANT
CLOTH BEACON ORANGE TIMEOUT ST (SAFETY) ×3 IMPLANT
COUNTER NEEDLE 1200 MAGNETIC (NEEDLE) ×2 IMPLANT
FORMULA NB 0-3 ENFAMIL (FORMULA) ×2 IMPLANT
GLOVE BIO SURGEON STRL SZ 6.5 (GLOVE) IMPLANT
GLOVE BIO SURGEON STRL SZ7 (GLOVE) ×3 IMPLANT
GLOVE BIO SURGEONS STRL SZ 6.5 (GLOVE)
GOWN STRL REUS W/TWL LRG LVL3 (GOWN DISPOSABLE) ×6 IMPLANT
NEEDLE MAYO .5 CIRCLE (NEEDLE) ×3 IMPLANT
NS IRRIG 1000ML POUR BTL (IV SOLUTION) ×3 IMPLANT
PACK VAGINAL MINOR WOMEN LF (CUSTOM PROCEDURE TRAY) ×3 IMPLANT
PAD OB MATERNITY 4.3X12.25 (PERSONAL CARE ITEMS) ×3 IMPLANT
PAD PREP 24X48 CUFFED NSTRL (MISCELLANEOUS) ×3 IMPLANT
SUT TICRON 2 BLUE 36 GS-21 (SUTURE) ×6 IMPLANT
SYR 30ML LL (SYRINGE) IMPLANT
SYR 50ML LL SCALE MARK (SYRINGE) ×2 IMPLANT
TOWEL OR 17X24 6PK STRL BLUE (TOWEL DISPOSABLE) ×6 IMPLANT
TRAY FOLEY CATH 14FR (SET/KITS/TRAYS/PACK) ×3 IMPLANT
TUBING NON-CON 1/4 X 20 CONN (TUBING) ×1 IMPLANT
TUBING NON-CON 1/4 X 20' CONN (TUBING) ×1
WATER STERILE IRR 1000ML POUR (IV SOLUTION) ×3 IMPLANT
YANKAUER SUCT BULB TIP NO VENT (SUCTIONS) ×2 IMPLANT

## 2014-08-16 NOTE — Anesthesia Postprocedure Evaluation (Signed)
  Anesthesia Post-op Note  Patient: Summer Thomas  Procedure(s) Performed: Procedure(s): Trans Vaginal Cerclage, Vaginal Ultrasound, Modified Mac[place], Bladder Instillation Milk (N/A)  Patient is awake, responsive, moving her legs, and has signs of resolution of her numbness. Pain and nausea are reasonably well controlled. Vital signs are stable and clinically acceptable. Oxygen saturation is clinically acceptable. There are no apparent anesthetic complications at this time. Patient is ready for discharge.

## 2014-08-16 NOTE — Brief Op Note (Signed)
08/16/2014  11:51 AM  PATIENT:  Summer Thomas  33 y.o. female  PRE-OPERATIVE DIAGNOSIS:  INCOMPETENT CERVIX  POST-OPERATIVE DIAGNOSIS:  INCOMPETENT CERVIX  PROCEDURE:  Procedure(s): Trans Vaginal Cerclage, Vaginal Ultrasound, Modified Macdonald, Bladder Instillation Milk (N/A)  SURGEON:  Surgeon(s) and Role:    * Loney Laurence, MD - Primary  ASSISTANTS: Dr. Arlyce Dice   ANESTHESIA:   spinal  EBL:  Total I/O In: 1000 [I.V.:1000] Out: 150 [Urine:100; Blood:50]   LOCAL MEDICATIONS USED:  OTHER saline injection, Enfamil instilled in bladder  SPECIMEN:  No Specimen  DISPOSITION OF SPECIMEN:  N/A  COUNTS:  YES  TOURNIQUET:  * No tourniquets in log *  DICTATION: .Note written in EPIC  PLAN OF CARE: Discharge to home after PACU  PATIENT DISPOSITION:  PACU - hemodynamically stable.   Delay start of Pharmacological VTE agent (>24hrs) due to surgical blood loss or risk of bleeding: not applicable  Technique:  After adequate spinal anesthesia was achieved the pt was examined and found to have only 1 cm of cervix below the reflexion of the vagina.  Intra op [place] confirmed that the bladder came down to the reflexion.  The pt was then prepped and draped in usual sterile fashion; a foley was placed and bladder emptied.  The cervix was grasped with a fenestrated rings and the bladder reflexion was injected with saline.  Saline was also injected in posterior reflexion in case as well.  The reflexion of the vagina onto the cervix was then tented up and carefully incised with the metzenbaums up another centimeter, staying close to the cervix and away from the bladder.  A 0 double loaded Ticron was then used to go from 12:00 to 9:00 and 9:00 to 6:00 with one end and then 12:00 to 3:00 and 3:00 to 6:00 with the other end.  This stitch came close to the posterior os.  A second stitch was placed in the same way just superior anteriorly to the previous stitch and then at least 2 cm superior on  the posterior cervix.  Both stitches were tied down tight over a small os dilator and tied on the POSTERIOR CERVIX with AIR KNOTs for identification.  The bladder flap was closed with a running stitch of 3-0 vicryl R.  The bladder was instilled with 60 cc Enfamil and then 60 cc saline and the bladder was intact without e/o spill.    The pt tolerated the procedure well and was returned to the recovery room in stable condition.

## 2014-08-16 NOTE — Anesthesia Preprocedure Evaluation (Signed)
Anesthesia Evaluation  Patient identified by MRN, date of birth, ID band Patient awake    Reviewed: Allergy & Precautions, H&P , Patient's Chart, lab work & pertinent test results, reviewed documented beta blocker date and time   Airway Mallampati: II TM Distance: >3 FB Neck ROM: full    Dental no notable dental hx.    Pulmonary  breath sounds clear to auscultation  Pulmonary exam normal       Cardiovascular hypertension, Rhythm:regular Rate:Normal     Neuro/Psych    GI/Hepatic   Endo/Other  Morbid obesity  Renal/GU      Musculoskeletal   Abdominal   Peds  Hematology   Anesthesia Other Findings   Reproductive/Obstetrics                           Anesthesia Physical Anesthesia Plan  ASA: III  Anesthesia Plan: General   Post-op Pain Management:    Induction: Intravenous  Airway Management Planned: Oral ETT  Additional Equipment:   Intra-op Plan:   Post-operative Plan: Extubation in OR  Informed Consent: I have reviewed the patients History and Physical, chart, labs and discussed the procedure including the risks, benefits and alternatives for the proposed anesthesia with the patient or authorized representative who has indicated his/her understanding and acceptance.   Dental Advisory Given and Dental advisory given  Plan Discussed with: CRNA and Surgeon  Anesthesia Plan Comments: (  Discussed general anesthesia, including possible nausea, instrumentation of airway, sore throat,pulmonary aspiration, etc. I asked if the were any outstanding questions, or  concerns before we proceeded. )        Anesthesia Quick Evaluation

## 2014-08-16 NOTE — Op Note (Signed)
08/16/2014  11:51 AM  PATIENT:  Summer Thomas  33 y.o. female  PRE-OPERATIVE DIAGNOSIS:  INCOMPETENT CERVIX  POST-OPERATIVE DIAGNOSIS:  INCOMPETENT CERVIX  PROCEDURE:  Procedure(s): Trans Vaginal Cerclage, Vaginal Ultrasound, Modified Macdonald, Bladder Instillation Milk (N/A)  SURGEON:  Surgeon(s) and Role:    * Loney Laurence, MD - Primary  ASSISTANTS: Dr. Arlyce Dice   ANESTHESIA:   spinal  EBL:  Total I/O In: 1000 [I.V.:1000] Out: 150 [Urine:100; Blood:50]   LOCAL MEDICATIONS USED:  OTHER saline injection, Enfamil instilled in bladder  SPECIMEN:  No Specimen  DISPOSITION OF SPECIMEN:  N/A  COUNTS:  YES  TOURNIQUET:  * No tourniquets in log *  DICTATION: .Note written in EPIC  PLAN OF CARE: Discharge to home after PACU  PATIENT DISPOSITION:  PACU - hemodynamically stable.   Delay start of Pharmacological VTE agent (>24hrs) due to surgical blood loss or risk of bleeding: not applicable  Findings: internal cervix >3 cm intraop.  External cervix only about 1 cm to os.  Cerclage placed with posterior air knots about 2.5 cm from external os.  Technique:  After adequate spinal anesthesia was achieved the pt was examined and found to have only 1 cm of cervix below the reflexion of the vagina.  Intra op [place] confirmed that the bladder came down to the reflexion.  The pt was then prepped and draped in usual sterile fashion; a foley was placed and bladder emptied.  The cervix was grasped with a fenestrated rings and the bladder reflexion was injected with saline.  Saline was also injected in posterior reflexion in case as well.  The reflexion of the vagina onto the cervix was then tented up and carefully incised with the metzenbaums up another centimeter, staying close to the cervix and away from the bladder.  A 0 double loaded Ticron was then used to go from 12:00 to 9:00 and 9:00 to 6:00 with one end and then 12:00 to 3:00 and 3:00 to 6:00 with the other end.  This stitch  came close to the posterior os.  A second stitch was placed in the same way just superior anteriorly to the previous stitch and then at least 2 cm superior on the posterior cervix.  Both stitches were tied down tight over a small os dilator and tied on the POSTERIOR CERVIX with AIR KNOTs for identification.  The bladder flap was closed with a running stitch of 3-0 vicryl R.  The bladder was instilled with 60 cc Enfamil and then 60 cc saline and the bladder was intact without e/o spill.    The pt tolerated the procedure well and was returned to the recovery room in stable condition.

## 2014-08-16 NOTE — Progress Notes (Signed)
There has been no change in the patients history, status or exam since the history and physical.  Filed Vitals:   08/16/14 0920 08/16/14 0921 08/16/14 0922  BP:   126/78  Pulse:  86   Temp:  98.4 F (36.9 C)   TempSrc:  Oral   Resp:  20   Height:  (1.676 m)    Weight: 102.967 kg (227 lb)    SpO2:  100%     Lab Results  Component Value Date   WBC 11.5* 08/16/2014   HGB 13.4 08/16/2014   HCT 38.7 08/16/2014   MCV 89.2 08/16/2014   PLT 285 08/16/2014   FHTs present by [place]- no funnelling noted but done on small portable US.  If cervix seems shorter in OR, will have [place] come in to evaluate.   Diany Formosa A

## 2014-08-16 NOTE — Transfer of Care (Signed)
Immediate Anesthesia Transfer of Care Note  Patient: Summer Thomas  Procedure(s) Performed: Procedure(s): Trans Vaginal Cerclage, Vaginal Ultrasound, Modified Macdonald, Bladder Instillation Milk (N/A)  Patient Location: PACU  Anesthesia Type:Spinal  Level of Consciousness: awake, alert  and oriented  Airway & Oxygen Therapy: Patient Spontanous Breathing  Post-op Assessment: Report given to PACU RN and Post -op Vital signs reviewed and stable  Post vital signs: Reviewed and stable  Complications: No apparent anesthesia complications

## 2014-08-16 NOTE — Anesthesia Procedure Notes (Signed)
Spinal  Patient location during procedure: OR Preanesthetic Checklist Completed: patient identified, site marked, surgical consent, pre-op evaluation, timeout performed, IV checked, risks and benefits discussed and monitors and equipment checked Spinal Block Patient position: sitting Prep: DuraPrep Patient monitoring: heart rate, cardiac monitor, continuous pulse ox and blood pressure Approach: midline Location: L3-4 Injection technique: single-shot Needle Needle type: Sprotte  Needle gauge: 24 G Needle length: 9 cm Assessment Sensory level: T10 Additional Notes Spinal Dosage in OR  5% Xylocaine ml       1.2

## 2014-08-17 ENCOUNTER — Encounter (HOSPITAL_COMMUNITY): Payer: Self-pay | Admitting: Obstetrics and Gynecology

## 2015-01-13 ENCOUNTER — Other Ambulatory Visit: Payer: Self-pay | Admitting: Obstetrics and Gynecology

## 2015-01-26 ENCOUNTER — Other Ambulatory Visit: Payer: Self-pay | Admitting: Obstetrics and Gynecology

## 2015-02-07 ENCOUNTER — Encounter (HOSPITAL_COMMUNITY): Payer: Self-pay

## 2015-02-07 NOTE — Patient Instructions (Addendum)
   Your procedure is scheduled on:  Thursday, March 17  [facility] 10:30 AM Pick up the phone at the desk and dial [phone] and inform us of your arrival.  Please call this number if you have any problems the morning of surgery: [phone]  Remember: Do not eat or drink after midnight: Wednesday Take these medicines the morning of surgery with a SIP OF WATER:  None  Do not wear jewelry, make-up, or FINGER nail polish No metal in your hair or on your body. Do not wear lotions, powders, perfumes.  You may wear deodorant.  Do not bring valuables to the hospital. Contacts, dentures or bridgework may not be worn into surgery.  Leave suitcase in the car. After Surgery it may be brought to your room. For patients being admitted to the hospital, checkout time is 11:00am the day of discharge.  Home with husband Summer Thomas  [phone] .

## 2015-02-08 ENCOUNTER — Encounter (HOSPITAL_COMMUNITY)
Admission: RE | Admit: 2015-02-08 | Discharge: 2015-02-08 | Disposition: A | Discharge status: Home / Self Care | Payer: 59 | Source: Ambulatory Visit | Attending: Obstetrics and Gynecology | Admitting: Obstetrics and Gynecology

## 2015-02-08 ENCOUNTER — Encounter (HOSPITAL_COMMUNITY): Payer: Self-pay

## 2015-02-08 HISTORY — DX: Missed abortion: O02.1

## 2015-02-08 LAB — CBC
HCT: 37.8 % (ref 36.0–46.0)
HEMOGLOBIN: 12.5 g/dL (ref 12.0–15.0)
MCH: 29.7 pg (ref 26.0–34.0)
MCHC: 33.1 g/dL (ref 30.0–36.0)
MCV: 89.8 fL (ref 78.0–100.0)
PLATELETS: 261 10*3/uL (ref 150–400)
RBC: 4.21 MIL/uL (ref 3.87–5.11)
RDW: 15.3 % (ref 11.5–15.5)
WBC: 10.1 10*3/uL (ref 4.0–10.5)

## 2015-02-09 ENCOUNTER — Other Ambulatory Visit: Payer: Self-pay | Admitting: Obstetrics and Gynecology

## 2015-02-09 LAB — RPR: RPR Ser Ql: NONREACTIVE

## 2015-02-09 MED ORDER — DEXTROSE 5 % IV SOLN
INTRAVENOUS | Status: DC
  Filled 2015-02-09: qty 14.5

## 2015-02-09 NOTE — H&P (Signed)
34 y.o.    [id number]  At 39 weeks comes in for a repeat cesarean section at term.  Patient has good fetal movement and no bleeding. Pt has had a cerclage since 13 1/2 weeks - no complications.  Past Medical History  Diagnosis Date  . Missed ab     x 2 - no surgery required  . Hypertension 2011-2013    no meds currently, no BP problems since 2013    Past Surgical History  Procedure Laterality Date  . Cervical cerclage  2011  . Cesarean section  08/2010    37 wks   . Cervical cerclage N/A 08/16/2014    Procedure: Trans Vaginal Cerclage, Vaginal Ultrasound, Modified Macdonald, Bladder Instillation Milk;  Surgeon: Loney LaurenceMichelle A Dequandre Cordova, MD;  Location: [facility];  Service: Gynecology;  Laterality: N/A;  . Wisdom tooth extraction      OB History  Gravida Para Term Preterm AB SAB TAB Ectopic Multiple Living  4 1 1  2 2    1     # Outcome Date GA Lbr Len/2nd Weight Sex Delivery Anes PTL Lv  4 Current           3 SAB 2014          2 Term 09/25/10 684w0d  3.062 kg (6 lb 12 oz) M CS-LTranv Spinal    1 SAB 2009 5864w0d             History   Social History  . Marital Status: Married    Spouse Name: N/A  . Number of Children: N/A  . Years of Education: N/A   Occupational History  . Not on file.   Social History Main Topics  . Smoking status: Never Smoker   . Smokeless tobacco: Never Used  . Alcohol Use: No  . Drug Use: No  . Sexual Activity: Yes    Birth Control/ Protection: None     Comment: pregnant   Other Topics Concern  . Not on file   Social History Narrative   [place]   Prenatal Course: Uncomplicated after cerclage placement.   Prenatal Transfer Tool  Maternal Diabetes: No Genetic Screening: declined Maternal Ultrasounds/Referrals: Normal Fetal Ultrasounds or other Referrals:  None Maternal Substance Abuse:  No Significant Maternal Medications:  None Significant Maternal Lab Results: None   There were no vitals filed for this visit.   Lungs/Cor:  NAD Abdomen:   soft, gravid Ex:  no cords, erythema SVE:  NA FHTs:  present  A/P   For repeat cesarean sectionat term after removal of two cerclage stitches.  All risks, benefits and alternatives discussed with patient and she desires to proceed.  Elazar Argabright A

## 2015-02-10 ENCOUNTER — Inpatient Hospital Stay (HOSPITAL_COMMUNITY): Payer: 59 | Admitting: Anesthesiology

## 2015-02-10 ENCOUNTER — Encounter (HOSPITAL_COMMUNITY)
Admission: RE | Disposition: A | Discharge status: Home / Self Care | Payer: Self-pay | Source: Ambulatory Visit | Attending: Obstetrics and Gynecology

## 2015-02-10 ENCOUNTER — Encounter (HOSPITAL_COMMUNITY): Payer: Self-pay | Admitting: *Deleted

## 2015-02-10 ENCOUNTER — Inpatient Hospital Stay (HOSPITAL_COMMUNITY)
Admission: RE | Admit: 2015-02-10 | Discharge: 2015-02-12 | DRG: 766 | Disposition: A | Discharge status: Home / Self Care | Payer: 59 | Source: Ambulatory Visit | Attending: Obstetrics and Gynecology | Admitting: Obstetrics and Gynecology

## 2015-02-10 DIAGNOSIS — O3421 Maternal care for scar from previous cesarean delivery: Secondary | ICD-10-CM | POA: Diagnosis present

## 2015-02-10 DIAGNOSIS — Z3A39 39 weeks gestation of pregnancy: Secondary | ICD-10-CM | POA: Diagnosis present

## 2015-02-10 DIAGNOSIS — Z9889 Other specified postprocedural states: Secondary | ICD-10-CM

## 2015-02-10 DIAGNOSIS — O09523 Supervision of elderly multigravida, third trimester: Secondary | ICD-10-CM

## 2015-02-10 HISTORY — PX: CERVICAL CERCLAGE: SHX1329

## 2015-02-10 HISTORY — DX: Other specified postprocedural states: Z98.890

## 2015-02-10 LAB — PREPARE RBC (CROSSMATCH)

## 2015-02-10 SURGERY — Surgical Case
Anesthesia: Spinal | Site: Vagina

## 2015-02-10 MED ORDER — FENTANYL CITRATE 0.05 MG/ML IJ SOLN
INTRAMUSCULAR | Status: AC
  Filled 2015-02-10: qty 2

## 2015-02-10 MED ORDER — MENTHOL 3 MG MT LOZG: 1.0000 | LOZENGE | OROMUCOSAL | Status: DC | PRN

## 2015-02-10 MED ORDER — SIMETHICONE 80 MG PO CHEW: 80.0000 mg | CHEWABLE_TABLET | ORAL | Status: DC | PRN

## 2015-02-10 MED ORDER — IBUPROFEN 600 MG PO TABS
600.0000 mg | ORAL_TABLET | Freq: Four times a day (QID) | ORAL | Status: DC
  Administered 2015-02-10 – 2015-02-12 (×8): 600 mg via ORAL
  Filled 2015-02-10 (×8): qty 1

## 2015-02-10 MED ORDER — LACTATED RINGERS IV SOLN
INTRAVENOUS | Status: DC
  Administered 2015-02-10: 12:00:00 via INTRAVENOUS

## 2015-02-10 MED ORDER — NALBUPHINE HCL 10 MG/ML IJ SOLN: 5.0000 mg | INTRAMUSCULAR | Status: DC | PRN

## 2015-02-10 MED ORDER — MEPERIDINE HCL 25 MG/ML IJ SOLN: 6.2500 mg | INTRAMUSCULAR | Status: DC | PRN

## 2015-02-10 MED ORDER — KETOROLAC TROMETHAMINE 30 MG/ML IJ SOLN
INTRAMUSCULAR | Status: AC
  Administered 2015-02-10: 30 mg via INTRAVENOUS
  Filled 2015-02-10: qty 1

## 2015-02-10 MED ORDER — KETOROLAC TROMETHAMINE 30 MG/ML IJ SOLN
30.0000 mg | Freq: Once | INTRAMUSCULAR | Status: AC | PRN
Start: 2015-02-10 — End: 2015-02-10
  Administered 2015-02-10: 30 mg via INTRAVENOUS

## 2015-02-10 MED ORDER — ACETAMINOPHEN 160 MG/5ML PO SOLN: 975.0000 mg | Freq: Once | ORAL | Status: DC

## 2015-02-10 MED ORDER — KETOROLAC TROMETHAMINE 30 MG/ML IJ SOLN: 30.0000 mg | Freq: Four times a day (QID) | INTRAMUSCULAR | Status: DC | PRN

## 2015-02-10 MED ORDER — ONDANSETRON HCL 4 MG/2ML IJ SOLN
INTRAMUSCULAR | Status: DC | PRN
  Administered 2015-02-10: 4 mg via INTRAVENOUS

## 2015-02-10 MED ORDER — METHYLERGONOVINE MALEATE 0.2 MG/ML IJ SOLN: 0.2000 mg | INTRAMUSCULAR | Status: DC | PRN

## 2015-02-10 MED ORDER — SIMETHICONE 80 MG PO CHEW
80.0000 mg | CHEWABLE_TABLET | Freq: Three times a day (TID) | ORAL | Status: DC
  Administered 2015-02-10 – 2015-02-12 (×5): 80 mg via ORAL
  Filled 2015-02-10 (×5): qty 1

## 2015-02-10 MED ORDER — FLEET ENEMA 7-19 GM/118ML RE ENEM: 1.0000 | ENEMA | Freq: Every day | RECTAL | Status: DC | PRN

## 2015-02-10 MED ORDER — OXYCODONE-ACETAMINOPHEN 5-325 MG PO TABS
2.0000 | ORAL_TABLET | ORAL | Status: DC | PRN
  Administered 2015-02-11 – 2015-02-12 (×5): 2 via ORAL
  Filled 2015-02-10 (×5): qty 2

## 2015-02-10 MED ORDER — ONDANSETRON HCL 4 MG/2ML IJ SOLN
INTRAMUSCULAR | Status: AC
  Filled 2015-02-10: qty 2

## 2015-02-10 MED ORDER — NALOXONE HCL 0.4 MG/ML IJ SOLN: 0.4000 mg | INTRAMUSCULAR | Status: DC | PRN

## 2015-02-10 MED ORDER — FENTANYL CITRATE 0.05 MG/ML IJ SOLN
25.0000 ug | INTRAMUSCULAR | Status: DC | PRN
  Administered 2015-02-10 (×2): 50 ug via INTRAVENOUS

## 2015-02-10 MED ORDER — PROMETHAZINE HCL 25 MG/ML IJ SOLN: 6.2500 mg | INTRAMUSCULAR | Status: DC | PRN

## 2015-02-10 MED ORDER — ONDANSETRON HCL 4 MG/2ML IJ SOLN: 4.0000 mg | Freq: Three times a day (TID) | INTRAMUSCULAR | Status: DC | PRN

## 2015-02-10 MED ORDER — GENTAMICIN SULFATE 40 MG/ML IJ SOLN: INTRAVENOUS | Status: DC

## 2015-02-10 MED ORDER — LACTATED RINGERS IV SOLN
INTRAVENOUS | Status: DC | PRN
  Administered 2015-02-10 (×2): via INTRAVENOUS

## 2015-02-10 MED ORDER — FENTANYL CITRATE 0.05 MG/ML IJ SOLN
INTRAMUSCULAR | Status: DC | PRN
  Administered 2015-02-10: 25 ug via INTRATHECAL

## 2015-02-10 MED ORDER — LACTATED RINGERS IV SOLN
Freq: Once | INTRAVENOUS | Status: AC
  Administered 2015-02-10: 11:00:00 via INTRAVENOUS

## 2015-02-10 MED ORDER — DIPHENHYDRAMINE HCL 50 MG/ML IJ SOLN: 12.5000 mg | INTRAMUSCULAR | Status: DC | PRN

## 2015-02-10 MED ORDER — NALOXONE HCL 1 MG/ML IJ SOLN
1.0000 ug/kg/h | INTRAVENOUS | Status: DC | PRN
  Filled 2015-02-10: qty 2

## 2015-02-10 MED ORDER — TETANUS-DIPHTH-ACELL PERTUSSIS 5-2.5-18.5 LF-MCG/0.5 IM SUSP: 0.5000 mL | Freq: Once | INTRAMUSCULAR | Status: DC

## 2015-02-10 MED ORDER — MORPHINE SULFATE (PF) 0.5 MG/ML IJ SOLN
INTRAMUSCULAR | Status: DC | PRN
  Administered 2015-02-10: .1 mg via INTRATHECAL

## 2015-02-10 MED ORDER — SCOPOLAMINE 1 MG/3DAYS TD PT72
MEDICATED_PATCH | TRANSDERMAL | Status: AC
Start: 2015-02-10 — End: 2015-02-10
  Filled 2015-02-10: qty 1

## 2015-02-10 MED ORDER — ACETAMINOPHEN 500 MG PO TABS
1000.0000 mg | ORAL_TABLET | Freq: Four times a day (QID) | ORAL | Status: AC
  Administered 2015-02-10 – 2015-02-11 (×3): 1000 mg via ORAL
  Filled 2015-02-10 (×4): qty 2

## 2015-02-10 MED ORDER — GENTAMICIN SULFATE 40 MG/ML IJ SOLN
INTRAVENOUS | Status: AC
  Administered 2015-02-10: 116.5 mL via INTRAVENOUS
  Filled 2015-02-10: qty 10.5

## 2015-02-10 MED ORDER — PRENATAL MULTIVITAMIN CH
1.0000 | ORAL_TABLET | Freq: Every day | ORAL | Status: DC
  Administered 2015-02-11 – 2015-02-12 (×2): 1 via ORAL
  Filled 2015-02-10 (×2): qty 1

## 2015-02-10 MED ORDER — WITCH HAZEL-GLYCERIN EX PADS: 1.0000 "application " | MEDICATED_PAD | CUTANEOUS | Status: DC | PRN

## 2015-02-10 MED ORDER — MIDAZOLAM HCL 2 MG/2ML IJ SOLN: 0.5000 mg | Freq: Once | INTRAMUSCULAR | Status: DC | PRN

## 2015-02-10 MED ORDER — FENTANYL CITRATE 0.05 MG/ML IJ SOLN
INTRAMUSCULAR | Status: AC
  Administered 2015-02-10: 50 ug via INTRAVENOUS
  Filled 2015-02-10: qty 2

## 2015-02-10 MED ORDER — DIBUCAINE 1 % RE OINT: 1.0000 "application " | TOPICAL_OINTMENT | RECTAL | Status: DC | PRN

## 2015-02-10 MED ORDER — LACTATED RINGERS IV SOLN
INTRAVENOUS | Status: DC
  Administered 2015-02-10 – 2015-02-11 (×2): via INTRAVENOUS

## 2015-02-10 MED ORDER — SENNOSIDES-DOCUSATE SODIUM 8.6-50 MG PO TABS
2.0000 | ORAL_TABLET | ORAL | Status: DC
  Administered 2015-02-11 (×2): 2 via ORAL
  Filled 2015-02-10 (×2): qty 2

## 2015-02-10 MED ORDER — MEASLES, MUMPS & RUBELLA VAC ~~LOC~~ INJ: 0.5000 mL | INJECTION | Freq: Once | SUBCUTANEOUS | Status: DC

## 2015-02-10 MED ORDER — SCOPOLAMINE 1 MG/3DAYS TD PT72
1.0000 | MEDICATED_PATCH | Freq: Once | TRANSDERMAL | Status: DC
  Administered 2015-02-10: 1.5 mg via TRANSDERMAL

## 2015-02-10 MED ORDER — OXYTOCIN 10 UNIT/ML IJ SOLN
40.0000 [IU] | INTRAMUSCULAR | Status: DC | PRN
  Administered 2015-02-10: 40 [IU] via INTRAVENOUS

## 2015-02-10 MED ORDER — OXYTOCIN 10 UNIT/ML IJ SOLN
INTRAMUSCULAR | Status: AC
  Filled 2015-02-10: qty 4

## 2015-02-10 MED ORDER — ACETAMINOPHEN 160 MG/5ML PO SOLN: 325.0000 mg | ORAL | Status: DC | PRN

## 2015-02-10 MED ORDER — NALBUPHINE HCL 10 MG/ML IJ SOLN: 5.0000 mg | Freq: Once | INTRAMUSCULAR | Status: AC | PRN

## 2015-02-10 MED ORDER — OXYCODONE-ACETAMINOPHEN 5-325 MG PO TABS
1.0000 | ORAL_TABLET | ORAL | Status: DC | PRN
  Administered 2015-02-11: 1 via ORAL
  Filled 2015-02-10: qty 1

## 2015-02-10 MED ORDER — PHENYLEPHRINE 8 MG IN D5W 100 ML (0.08MG/ML) PREMIX OPTIME
INJECTION | INTRAVENOUS | Status: AC
  Filled 2015-02-10: qty 100

## 2015-02-10 MED ORDER — FERROUS SULFATE 325 (65 FE) MG PO TABS
325.0000 mg | ORAL_TABLET | Freq: Two times a day (BID) | ORAL | Status: DC
  Administered 2015-02-11 – 2015-02-12 (×3): 325 mg via ORAL
  Filled 2015-02-10 (×3): qty 1

## 2015-02-10 MED ORDER — DIPHENHYDRAMINE HCL 25 MG PO CAPS
25.0000 mg | ORAL_CAPSULE | ORAL | Status: DC | PRN
  Filled 2015-02-10: qty 1

## 2015-02-10 MED ORDER — DIPHENHYDRAMINE HCL 25 MG PO CAPS: 25.0000 mg | ORAL_CAPSULE | Freq: Four times a day (QID) | ORAL | Status: DC | PRN

## 2015-02-10 MED ORDER — METHYLERGONOVINE MALEATE 0.2 MG PO TABS: 0.2000 mg | ORAL_TABLET | ORAL | Status: DC | PRN

## 2015-02-10 MED ORDER — MORPHINE SULFATE 0.5 MG/ML IJ SOLN
INTRAMUSCULAR | Status: AC
  Filled 2015-02-10: qty 10

## 2015-02-10 MED ORDER — IBUPROFEN 600 MG PO TABS: 600.0000 mg | ORAL_TABLET | Freq: Four times a day (QID) | ORAL | Status: DC | PRN

## 2015-02-10 MED ORDER — ZOLPIDEM TARTRATE 5 MG PO TABS: 5.0000 mg | ORAL_TABLET | Freq: Every evening | ORAL | Status: DC | PRN

## 2015-02-10 MED ORDER — SCOPOLAMINE 1 MG/3DAYS TD PT72: 1.0000 | MEDICATED_PATCH | Freq: Once | TRANSDERMAL | Status: DC

## 2015-02-10 MED ORDER — PHENYLEPHRINE 8 MG IN D5W 100 ML (0.08MG/ML) PREMIX OPTIME
INJECTION | INTRAVENOUS | Status: DC | PRN
  Administered 2015-02-10: 60 ug/min via INTRAVENOUS

## 2015-02-10 MED ORDER — ACETAMINOPHEN 325 MG PO TABS: 325.0000 mg | ORAL_TABLET | ORAL | Status: DC | PRN

## 2015-02-10 MED ORDER — BISACODYL 10 MG RE SUPP: 10.0000 mg | Freq: Every day | RECTAL | Status: DC | PRN

## 2015-02-10 MED ORDER — SODIUM CHLORIDE 0.9 % IJ SOLN: 3.0000 mL | INTRAMUSCULAR | Status: DC | PRN

## 2015-02-10 MED ORDER — LANOLIN HYDROUS EX OINT: 1.0000 "application " | TOPICAL_OINTMENT | CUTANEOUS | Status: DC | PRN

## 2015-02-10 MED ORDER — SIMETHICONE 80 MG PO CHEW
80.0000 mg | CHEWABLE_TABLET | ORAL | Status: DC
  Administered 2015-02-11 (×2): 80 mg via ORAL
  Filled 2015-02-10 (×2): qty 1

## 2015-02-10 MED ORDER — OXYTOCIN 40 UNITS IN LACTATED RINGERS INFUSION - SIMPLE MED: 62.5000 mL/h | INTRAVENOUS | Status: AC

## 2015-02-10 MED ORDER — ACETAMINOPHEN 325 MG PO TABS
650.0000 mg | ORAL_TABLET | ORAL | Status: DC | PRN
  Filled 2015-02-10: qty 2

## 2015-02-10 SURGICAL SUPPLY — 35 items
APL SKNCLS STERI-STRIP NONHPOA (GAUZE/BANDAGES/DRESSINGS) ×2
BENZOIN TINCTURE PRP APPL 2/3 (GAUZE/BANDAGES/DRESSINGS) ×4 IMPLANT
CLAMP CORD UMBIL (MISCELLANEOUS) IMPLANT
CLOSURE WOUND 1/2 X4 (GAUZE/BANDAGES/DRESSINGS) ×1
CLOTH BEACON ORANGE TIMEOUT ST (SAFETY) ×4 IMPLANT
DRAPE SHEET LG 3/4 BI-LAMINATE (DRAPES) IMPLANT
DRSG OPSITE POSTOP 4X10 (GAUZE/BANDAGES/DRESSINGS) ×4 IMPLANT
DURAPREP 26ML APPLICATOR (WOUND CARE) ×4 IMPLANT
ELECT REM PT RETURN 9FT ADLT (ELECTROSURGICAL) ×4
ELECTRODE REM PT RTRN 9FT ADLT (ELECTROSURGICAL) ×2 IMPLANT
EXTRACTOR VACUUM BELL STYLE (SUCTIONS) IMPLANT
GAUZE SPONGE 4X4 12PLY STRL (GAUZE/BANDAGES/DRESSINGS) ×2 IMPLANT
GLOVE BIO SURGEON STRL SZ7 (GLOVE) ×4 IMPLANT
GOWN STRL REUS W/TWL LRG LVL3 (GOWN DISPOSABLE) ×8 IMPLANT
KIT ABG SYR 3ML LUER SLIP (SYRINGE) IMPLANT
NDL HYPO 25X5/8 SAFETYGLIDE (NEEDLE) IMPLANT
NEEDLE HYPO 25X5/8 SAFETYGLIDE (NEEDLE) IMPLANT
NS IRRIG 1000ML POUR BTL (IV SOLUTION) ×4 IMPLANT
PACK C SECTION WH (CUSTOM PROCEDURE TRAY) ×4 IMPLANT
PAD ABD 8X7 1/2 STERILE (GAUZE/BANDAGES/DRESSINGS) ×2 IMPLANT
PAD OB MATERNITY 4.3X12.25 (PERSONAL CARE ITEMS) ×4 IMPLANT
RTRCTR C-SECT PINK 25CM LRG (MISCELLANEOUS) ×4 IMPLANT
STAPLER VISISTAT 35W (STAPLE) IMPLANT
STRIP CLOSURE SKIN 1/2X4 (GAUZE/BANDAGES/DRESSINGS) ×3 IMPLANT
SUT MNCRL 0 VIOLET CTX 36 (SUTURE) ×4 IMPLANT
SUT MONOCRYL 0 CTX 36 (SUTURE) ×4
SUT PDS AB 0 CTX 60 (SUTURE) IMPLANT
SUT PLAIN 2 0 XLH (SUTURE) IMPLANT
SUT VIC AB 0 CT1 27 (SUTURE) ×8
SUT VIC AB 0 CT1 27XBRD ANBCTR (SUTURE) ×4 IMPLANT
SUT VIC AB 2-0 CT1 27 (SUTURE) ×4
SUT VIC AB 2-0 CT1 TAPERPNT 27 (SUTURE) ×2 IMPLANT
SUT VIC AB 4-0 KS 27 (SUTURE) ×4 IMPLANT
TOWEL OR 17X24 6PK STRL BLUE (TOWEL DISPOSABLE) ×4 IMPLANT
TRAY FOLEY CATH 14FR (SET/KITS/TRAYS/PACK) ×4 IMPLANT

## 2015-02-10 NOTE — Progress Notes (Signed)
There has been no change in the patients history, status or exam since the history and physical.  Filed Vitals:   02/10/15 1045  BP: 146/73  Pulse: 103  Temp: 99.1 F (37.3 C)  TempSrc: Oral  Resp: 18  SpO2: 100%    Lab Results  Component Value Date   WBC 10.1 02/08/2015   HGB 12.5 02/08/2015   HCT 37.8 02/08/2015   MCV 89.8 02/08/2015   PLT 261 02/08/2015    Alegandro Macnaughton A

## 2015-02-10 NOTE — Anesthesia Procedure Notes (Signed)
Spinal Patient location during procedure: OR Preanesthetic Checklist Completed: patient identified, site marked, surgical consent, pre-op evaluation, timeout performed, IV checked, risks and benefits discussed and monitors and equipment checked Spinal Block Patient position: sitting Prep: DuraPrep Patient monitoring: heart rate, cardiac monitor, continuous pulse ox and blood pressure Approach: midline Location: L3-4 Injection technique: single-shot Needle Needle type: Sprotte  Needle gauge: 24 G Needle length: 9 cm Assessment Sensory level: T4 Additional Notes Spinal Dosage in OR  Bupivicaine ml       1.7 PFMS04   mcg        100 Fentanyl mcg            25    

## 2015-02-10 NOTE — Anesthesia Preprocedure Evaluation (Signed)
Anesthesia Evaluation  Patient identified by MRN, date of birth, ID band Patient awake    Reviewed: Allergy & Precautions, H&P , NPO status , Patient's Chart, lab work & pertinent test results  Airway Mallampati: III       Dental   Pulmonary  breath sounds clear to auscultation        Cardiovascular Exercise Tolerance: Good Rhythm:regular Rate:Normal     Neuro/Psych    GI/Hepatic   Endo/Other  Morbid obesity  Renal/GU      Musculoskeletal   Abdominal   Peds  Hematology   Anesthesia Other Findings   Reproductive/Obstetrics (+) Pregnancy                             Anesthesia Physical Anesthesia Plan  ASA: III  Anesthesia Plan: Spinal   Post-op Pain Management:    Induction:   Airway Management Planned:   Additional Equipment:   Intra-op Plan:   Post-operative Plan:   Informed Consent: I have reviewed the patients History and Physical, chart, labs and discussed the procedure including the risks, benefits and alternatives for the proposed anesthesia with the patient or authorized representative who has indicated his/her understanding and acceptance.     Plan Discussed with: Anesthesiologist, CRNA and Surgeon  Anesthesia Plan Comments:         Anesthesia Quick Evaluation  

## 2015-02-10 NOTE — Transfer of Care (Signed)
Immediate Anesthesia Transfer of Care Note  Patient: Summer ReichmannSusan L Thomas  Procedure(s) Performed: Procedure(s): CESAREAN SECTION ,  (N/A) CERCLAGE CERVICAL (N/A)  Patient Location: PACU  Anesthesia Type:Spinal  Level of Consciousness: awake, alert  and oriented  Airway & Oxygen Therapy: Patient Spontanous Breathing  Post-op Assessment: Report given to RN and Post -op Vital signs reviewed and stable  Post vital signs: Reviewed and stable  Last Vitals:  Filed Vitals:   02/10/15 1045  BP: 146/73  Pulse: 103  Temp: 37.3 C  Resp: 18    Complications: No apparent anesthesia complications

## 2015-02-10 NOTE — Op Note (Signed)
02/10/2015  12:45 PM  PATIENT:  Summer Thomas  34 y.o. female  PRE-OPERATIVE DIAGNOSIS:  REPEAT cesarean with cerclage removal  POST-OPERATIVE DIAGNOSIS:  same  PROCEDURE:  Procedure(s): CESAREAN SECTION ,  (N/A) CERCLAGE CERVICAL (N/A)  SURGEON:  Surgeon(s) and Role:    * Carrington Clamp, MD - Primary    * Marlow Baars, MD - Assisting   ANESTHESIA:   spinal  EBL:  Total I/O In: 2400 [I.V.:2400] Out: 600 [Urine:100; Blood:500]   SPECIMEN:  No Specimen  DISPOSITION OF SPECIMEN:  N/A  COUNTS:  YES  TOURNIQUET:  * No tourniquets in log *  DICTATION: .Note written in EPIC  PLAN OF CARE: Admit to inpatient   PATIENT DISPOSITION:  PACU - hemodynamically stable.   Delay start of Pharmacological VTE agent (>24hrs) due to surgical blood loss or risk of bleeding: not applicable  Complications:  none Medications:  Ancef, Pitocin Findings:  Baby female, Apgars 8,9, weight 8#1.   Normal tubes, ovaries and uterus seen.  Baby was skin to skin with mother after birth in the OR.  Very thin lower uterine segment- only a few cell layers thick- pt and husbasnd were made aware.  Technique:  After adequate spinal anesthesia was achieved, the patient was prepped and draped in usual sterile fashion.  A foley catheter was used to drain the bladder.  A speculum was placed in the vagina and the stitches identified and removed with scissors in toto.  Bleeding from site was initially brisk but reduced by silver nitrate and a four by four pack.  An attempt at a stitch was done but pulled through.  Pt was then prepped and draped for cesarean.  A pfannanstiel incision was made with the scalpel and carried down to the fascia with the bovie cautery. The fascia was incised in the midline with the scalpel and carried in a transverse curvilinear manner bilaterally.  The fascia was reflected superiorly and inferiorly off the rectus muscles and the muscles split in the midline.  A bowel free portion of  the peritoneum was entered bluntly and then extended in a superior and inferior manner with good visualization of the bowel and bladder.  The Alexis instrument was then placed and the vesico-uterine fascia tented up and incised in a transverse curvilinear manner.  A 2 cm transverse incision was made in the very thin upper portion of the lower uterine segment until the amnion was exposed.   The incision was extended transversely in a blunt manner.  Clear fluid was noted and the baby delivered in the vertex presentation without complication.  The baby was bulb suctioned and the cord was clamped and cut.  The baby was then handed to awaiting Neonatology.  The placenta was then delivered manually and the uterus cleared of all debris.  The uterine incision was then closed with a running lock stitch of 0 monocryl.  An imbricating layer of 0 monocryl was closed as well. Excellent hemostasis of the uterine incision was achieved and the abdomen was cleared with irrigation.  The peritoneum was closed with a running stitch of 2-0 vicryl.  This incorporated the rectus muscles as a separate layer.  The fascia was then closed with a running stitch of 0 vicryl.  The subcutaneous layer was closed with interrupted  stitches of 2-0 plain gut.  The skin was closed with 4-0 vicryl on a Keith needle and steri-strips.  The four by four pack was removed from the vagina and there was scant bleeding  from the vagina. The patient tolerated the procedure well and was returned to the recovery room in stable condition.   All counts were correct times three.  Alicen Donalson A

## 2015-02-10 NOTE — Brief Op Note (Signed)
02/10/2015  12:45 PM  PATIENT:  Summer Thomas  34 y.o. female  PRE-OPERATIVE DIAGNOSIS:  REPEAT cesarean with cerclage removal  POST-OPERATIVE DIAGNOSIS:  same  PROCEDURE:  Procedure(s): CESAREAN SECTION ,  (N/A) CERCLAGE CERVICAL (N/A)  SURGEON:  Surgeon(s) and Role:    * Kahmya Pinkham, MD - Primary    * Dyanna Clark, MD - Assisting   ANESTHESIA:   spinal  EBL:  Total I/O In: 2400 [I.V.:2400] Out: 600 [Urine:100; Blood:500]   SPECIMEN:  No Specimen  DISPOSITION OF SPECIMEN:  N/A  COUNTS:  YES  TOURNIQUET:  * No tourniquets in log *  DICTATION: .Note written in EPIC  PLAN OF CARE: Admit to inpatient   PATIENT DISPOSITION:  PACU - hemodynamically stable.   Delay start of Pharmacological VTE agent (>24hrs) due to surgical blood loss or risk of bleeding: not applicable  Complications:  none Medications:  Ancef, Pitocin Findings:  Baby female, Apgars 8,9, weight 8#1.   Normal tubes, ovaries and uterus seen.  Baby was skin to skin with mother after birth in the OR.  Very thin lower uterine segment- only a few cell layers thick- pt and husbasnd were made aware.  Technique:  After adequate spinal anesthesia was achieved, the patient was prepped and draped in usual sterile fashion.  A foley catheter was used to drain the bladder.  A speculum was placed in the vagina and the stitches identified and removed with scissors in toto.  Bleeding from site was initially brisk but reduced by silver nitrate and a four by four pack.  An attempt at a stitch was done but pulled through.  Pt was then prepped and draped for cesarean.  A pfannanstiel incision was made with the scalpel and carried down to the fascia with the bovie cautery. The fascia was incised in the midline with the scalpel and carried in a transverse curvilinear manner bilaterally.  The fascia was reflected superiorly and inferiorly off the rectus muscles and the muscles split in the midline.  A bowel free portion of  the peritoneum was entered bluntly and then extended in a superior and inferior manner with good visualization of the bowel and bladder.  The Alexis instrument was then placed and the vesico-uterine fascia tented up and incised in a transverse curvilinear manner.  A 2 cm transverse incision was made in the very thin upper portion of the lower uterine segment until the amnion was exposed.   The incision was extended transversely in a blunt manner.  Clear fluid was noted and the baby delivered in the vertex presentation without complication.  The baby was bulb suctioned and the cord was clamped and cut.  The baby was then handed to awaiting Neonatology.  The placenta was then delivered manually and the uterus cleared of all debris.  The uterine incision was then closed with a running lock stitch of 0 monocryl.  An imbricating layer of 0 monocryl was closed as well. Excellent hemostasis of the uterine incision was achieved and the abdomen was cleared with irrigation.  The peritoneum was closed with a running stitch of 2-0 vicryl.  This incorporated the rectus muscles as a separate layer.  The fascia was then closed with a running stitch of 0 vicryl.  The subcutaneous layer was closed with interrupted  stitches of 2-0 plain gut.  The skin was closed with 4-0 vicryl on a Keith needle and steri-strips.  The four by four pack was removed from the vagina and there was scant bleeding   from the vagina. The patient tolerated the procedure well and was returned to the recovery room in stable condition.   All counts were correct times three.  Loran Fleet A    

## 2015-02-10 NOTE — Lactation Note (Signed)
This note was copied from the chart of [name]. Lactation Consultation Note  Patient Name: [name] ZOXWR'UToday's Date: 02/10/2015 Reason for consult: Initial assessment;Difficult latch;Other (Comment) (hx of low milk supply with first baby) This is mom's second baby.  She states that her first child (now 544 yo) was breastfed for a few weeks but she stopped breastfeeding and pumping when he was not gaining weight and she never obtained more than 1-2 oz when she pumped, so believes she had insufficient milk.  This first child was born early and smaller than her newborn.  The newborn has been unable to latch and is spitty and gaggy.  RN, Waynetta SandyBeth had recently attempted to assist mom, using a hand pump to pre-pump and evert her totally flat nipples.  Beth, RN also used a #20 NS which helped evert the nipple but baby refused to latch.  LC assessed mom's nipples and due to their being flat and not distinguishable from surrounding areola, the #20 NS and the #24 hand pump flange appear appropriate.  LC stressed that comfort of mom's nipples and breast would determine if she needs a larger flange or shield.  RN had also provided the NS written handout and LC informed mom that if baby begins using NS at all feedings, she will need to pump at least 4 times per 24 hours.  LC encouraged frequent STS and cue feedings, once baby over the spitty/gaggy behavior.Mom encouraged to feed baby 8-12 times/24 hours and with feeding cues, but reviewed normal feeding aversion and newborn sleepiness when baby is less than 24 hours old.  LC encouraged review of Baby and Me pp 9, 14 and 20-25 for STS and BF information. LC provided [facility] [employer] Resource brochure and reviewed [facility] services and list of community and web site resources.    Maternal Data Formula Feeding for Exclusion: No Has patient been taught Hand Expression?: Yes (per RN) Does the patient have breastfeeding experience prior to this delivery?: Yes  Feeding     LATCH Score/Interventions        initial LATCH score=4 due to baby not latching and mom with flat nipples              Lactation Tools Discussed/Used   STS, cue feedings, pre-pump and nipple shield for assisting to evert her nipples   Consult Status Consult Status: Follow-up Date: 02/11/15 Follow-up type: In-patient    Warrick ParisianBryant, Rucker Pridgeon [facility] 02/10/2015, 8:48 PM

## 2015-02-10 NOTE — Anesthesia Postprocedure Evaluation (Signed)
  Anesthesia Post-op Note  Patient: Summer ReichmannSusan L Thomas  Procedure(s) Performed: Procedure(s): CESAREAN SECTION ,  (N/A) CERCLAGE CERVICAL (N/A)  Patient is awake, responsive, moving her legs, and has signs of resolution of her numbness. Pain and nausea are reasonably well controlled. Vital signs are stable and clinically acceptable. Oxygen saturation is clinically acceptable. There are no apparent anesthetic complications at this time. Patient is ready for discharge.

## 2015-02-11 ENCOUNTER — Encounter (HOSPITAL_COMMUNITY): Payer: Self-pay | Admitting: Obstetrics and Gynecology

## 2015-02-11 LAB — CBC
HCT: 32.5 % — ABNORMAL LOW (ref 36.0–46.0)
Hemoglobin: 10.7 g/dL — ABNORMAL LOW (ref 12.0–15.0)
MCH: 29.5 pg (ref 26.0–34.0)
MCHC: 32.9 g/dL (ref 30.0–36.0)
MCV: 89.5 fL (ref 78.0–100.0)
Platelets: 236 10*3/uL (ref 150–400)
RBC: 3.63 MIL/uL — ABNORMAL LOW (ref 3.87–5.11)
RDW: 15.4 % (ref 11.5–15.5)
WBC: 10.4 10*3/uL (ref 4.0–10.5)

## 2015-02-11 LAB — BIRTH TISSUE RECOVERY COLLECTION (PLACENTA DONATION)

## 2015-02-11 NOTE — Addendum Note (Signed)
Addendum  created 02/11/15 0724 by Yolonda KidaAlison L Mykaila Blunck, CRNA   Modules edited: Notes Section   Notes Section:  File: [id number]; File: [id number]

## 2015-02-11 NOTE — Progress Notes (Signed)
Patient is doing well.  She is tolerating PO, ambulating.  Foley catheter just removed, not yet voided.  Pain is controlled.  Lochia is appropriate  Filed Vitals:   02/10/15 1927 02/10/15 1933 02/10/15 2345 02/11/15 0430  BP: 106/59 110/56 115/58 112/62  Pulse: 92 110 70 80  Temp:   97.8 F (36.6 C) 98 F (36.7 C)  TempSrc:   Oral Oral  Resp: 16 16 20 18   SpO2:   96% 95%    NAD Lungs:   clear to auscultation Heart:   RRR Abdomen:  soft, appropriate tenderness, incisions intact and without erythema or drainage ext:    Symmetric, trace edema bilaterally  Lab Results  Component Value Date   WBC 10.4 02/11/2015   HGB 10.7* 02/11/2015   HCT 32.5* 02/11/2015   MCV 89.5 02/11/2015   PLT 236 02/11/2015    --/--/A POS (03/15 1440)/RImmune  A/P    33 y.o. [id number] POD 1 s/p RCS Routine post op and postpartum care.    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  Will defer circ to pediatric urology

## 2015-02-11 NOTE — Anesthesia Postprocedure Evaluation (Addendum)
  Anesthesia Post-op Note  Patient: Summer Thomas  Procedure(s) Performed: Procedure(s): CESAREAN SECTION ,  (N/A) CERCLAGE CERVICAL (N/A)  Patient Location: Mother/Baby  Anesthesia Type:Spinal  Level of Consciousness: awake, alert , oriented and patient cooperative  Airway and Oxygen Therapy: Patient Spontanous Breathing  Post-op Pain: none  Post-op Assessment: Post-op Vital signs reviewed, Patient's Cardiovascular Status Stable, Respiratory Function Stable, Patent Airway, No headache, No backache, No residual numbness and No residual motor weakness  Post-op Vital Signs: Reviewed and stable  Last Vitals:  Filed Vitals:   02/11/15 0430  BP: 112/62  Pulse: 80  Temp: 36.7 C  Resp: 18    Complications: No apparent anesthesia complications

## 2015-02-12 ENCOUNTER — Ambulatory Visit: Payer: Self-pay

## 2015-02-12 LAB — TYPE AND SCREEN
ABO/RH(D): A POS
Antibody Screen: NEGATIVE
UNIT DIVISION: 0
Unit division: 0

## 2015-02-12 MED ORDER — OXYCODONE-ACETAMINOPHEN 5-325 MG PO TABS: 1.0000 | ORAL_TABLET | Freq: Four times a day (QID) | ORAL | Status: DC | PRN

## 2015-02-12 MED ORDER — IBUPROFEN 600 MG PO TABS: 600.0000 mg | ORAL_TABLET | Freq: Four times a day (QID) | ORAL | Status: DC

## 2015-02-12 NOTE — Discharge Instructions (Signed)
You may wash incision with soap and water.   Do not soak the incision for 2 weeks (no tub baths or swimming).   Keep incision dry. You may need to keep a sanitary pad or panty liner between the incision and your clothing for comfort and to keep the incision dry.  If you note drainage, increased pain, or increased redness of the incision, then please notify your physician.  Pelvic rest x 6 weeks (no intercourse or tampons)   No lifting over 10 lbs for 6 weeks.   Do not drive until you are not taking narcotic pain medication AND you can comfortably slam on the brakes.  Please continue to take a prenatal vitamin daily

## 2015-02-12 NOTE — Progress Notes (Signed)
Patient is doing well.  She is tolerating PO, ambulating, voiding.  Pain is controlled.  Lochia is appropriate  Filed Vitals:   02/11/15 0830 02/11/15 1200 02/11/15 1816 02/12/15 0630  BP: 125/56 115/54 123/63 147/83  Pulse: 71 80 76 77  Temp: 97.4 F (36.3 C) 98.1 F (36.7 C) 97.9 F (36.6 C) 98 F (36.7 C)  TempSrc: Oral Oral Oral   Resp: 18 18 18 20   SpO2: 95% 97%      NAD Abdomen:  soft, appropriate tenderness, incisions intact and without erythema or drainage ext:    Symmetric, trace edema bilaterally  Lab Results  Component Value Date   WBC 10.4 02/11/2015   HGB 10.7* 02/11/2015   HCT 32.5* 02/11/2015   MCV 89.5 02/11/2015   PLT 236 02/11/2015    --/--/A POS (03/15 1440)/RImmune  A/P    33 y.o. [id number] POD 2 s/p RCS Routine post op and postpartum care.   Meeting all goals. Desires d/c today.   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  Will defer circ to pediatric urology

## 2015-02-12 NOTE — Lactation Note (Signed)
This note was copied from the chart of Boy Lonzo CloudSusan Donath. Lactation Consultation Note  Patient Name: Boy Lonzo CloudSusan Woolman ZOXWR'UToday's Date: 02/12/2015 Reason for consult: Follow-up assessment  Of this mom and term baby, now 448 hours old. Mom has been breast feeding baby with a 20 nipple shield, and he is being supplemented with formula. Mom is pumping, and has a DEP at home. i advised her to pump at least every 3 hours, breast care reviewed. I gave mom a 20 and 24 nipple shield. i felt the 24 shield felt mom better, but the baby's mouth was full with just the nipple of the 24. Mom also has 2 - 20 nipple shilels to take home. The baby has an upper lip frenulum that extends to the gum line, and a tongue that does not appear to extend beyond the gum line. When latching without the shield, mom feels painful biting. Dad said he had a "tight" mouth as a baby, and required speech therapy. I advised the parents to talk to their pediatrician on 3/21 about the baby's oral anatomy, and told them that there were some intervention that would help both  The baby and breast feeding. Mom knows to call for questions/cocnerns.    Maternal Data    Feeding Feeding Type: Bottle Fed - Formula  LATCH Score/Interventions                      Lactation Tools Discussed/Used Tools: Nipple Shields Nipple shield size: 20;24 Pump Review: Setup, frequency, and cleaning;Milk Storage;Other (comment) (hand expression reviewed)   Consult Status Consult Status: Complete Follow-up type: Call as needed    Alfred LevinsLee, Shaquella Stamant Anne 02/12/2015, 2:55 PM

## 2015-02-12 NOTE — Discharge Summary (Signed)
Obstetric Discharge Summary Reason for Admission: cesarean section Prenatal Procedures: none Intrapartum Procedures: cesarean: low cervical, transverse and cerclage removal Postpartum Procedures: none Complications-Operative and Postpartum: none HEMOGLOBIN  Date Value Ref Range Status  02/11/2015 10.7* 12.0 - 15.0 g/dL Final   HCT  Date Value Ref Range Status  02/11/2015 32.5* 36.0 - 46.0 % Final    Physical Exam:  General: alert and cooperative Lochia: appropriate Uterine Fundus: firm Incision: healing well, no significant drainage DVT Evaluation: No evidence of DVT seen on physical exam.  Discharge Diagnoses: Term Pregnancy-delivered  Discharge Information: Date: 02/12/2015 Activity: pelvic rest Diet: routine Medications: PNV, Ibuprofen and Percocet Condition: stable Instructions: refer to practice specific booklet Discharge to: home Follow-up Information    Follow up with HORVATH,MICHELLE A, MD In 10 days.   Specialty:  Obstetrics and Gynecology   Why:  10d incision check, 4 wks PP visit   Contact information:   [address] [place] [address] [phone]       Newborn Data: Live born female  Birth Weight: 8 lb 7.3 oz (3835 g) APGAR: 8, 9  Home with mother.  Dallas Scorsone GEFFEL Doha Boling 02/12/2015, 9:08 AM

## 2015-03-15 ENCOUNTER — Other Ambulatory Visit: Payer: Self-pay | Admitting: Obstetrics and Gynecology

## 2015-03-16 LAB — CYTOLOGY - PAP

## 2020-02-01 ENCOUNTER — Other Ambulatory Visit: Payer: Self-pay | Admitting: Obstetrics and Gynecology

## 2020-02-01 DIAGNOSIS — R928 Other abnormal and inconclusive findings on diagnostic imaging of breast: Secondary | ICD-10-CM

## 2020-02-16 ENCOUNTER — Other Ambulatory Visit: Payer: Self-pay | Admitting: Obstetrics and Gynecology

## 2020-02-16 ENCOUNTER — Ambulatory Visit
Admission: RE | Admit: 2020-02-16 | Discharge: 2020-02-16 | Disposition: A | Discharge status: Home / Self Care | Payer: 59 | Source: Ambulatory Visit | Attending: Obstetrics and Gynecology | Admitting: Obstetrics and Gynecology

## 2020-02-16 ENCOUNTER — Other Ambulatory Visit: Payer: Self-pay

## 2020-02-16 DIAGNOSIS — R928 Other abnormal and inconclusive findings on diagnostic imaging of breast: Secondary | ICD-10-CM

## 2020-02-16 DIAGNOSIS — N6320 Unspecified lump in the left breast, unspecified quadrant: Secondary | ICD-10-CM

## 2020-08-19 ENCOUNTER — Other Ambulatory Visit: Payer: 59

## 2020-09-02 ENCOUNTER — Ambulatory Visit
Admission: RE | Admit: 2020-09-02 | Discharge: 2020-09-02 | Disposition: A | Discharge status: Home / Self Care | Payer: 59 | Source: Ambulatory Visit | Attending: Obstetrics and Gynecology | Admitting: Obstetrics and Gynecology

## 2020-09-02 ENCOUNTER — Other Ambulatory Visit: Payer: Self-pay

## 2020-09-02 ENCOUNTER — Other Ambulatory Visit: Payer: Self-pay | Admitting: Obstetrics and Gynecology

## 2020-09-02 DIAGNOSIS — N6320 Unspecified lump in the left breast, unspecified quadrant: Secondary | ICD-10-CM

## 2021-02-24 ENCOUNTER — Other Ambulatory Visit: Payer: 59

## 2021-04-07 ENCOUNTER — Ambulatory Visit
Admission: RE | Admit: 2021-04-07 | Discharge: 2021-04-07 | Disposition: A | Discharge status: Home / Self Care | Payer: Self-pay | Source: Ambulatory Visit | Attending: Obstetrics and Gynecology | Admitting: Obstetrics and Gynecology

## 2021-04-07 ENCOUNTER — Other Ambulatory Visit: Payer: Self-pay

## 2021-04-07 ENCOUNTER — Ambulatory Visit
Admission: RE | Admit: 2021-04-07 | Discharge: 2021-04-07 | Disposition: A | Discharge status: Home / Self Care | Payer: BC Managed Care – PPO | Source: Ambulatory Visit | Attending: Obstetrics and Gynecology | Admitting: Obstetrics and Gynecology

## 2021-04-07 DIAGNOSIS — N6320 Unspecified lump in the left breast, unspecified quadrant: Secondary | ICD-10-CM

## 2021-10-09 ENCOUNTER — Ambulatory Visit
Admission: EM | Admit: 2021-10-09 | Discharge: 2021-10-09 | Disposition: A | Discharge status: Home / Self Care | Payer: BC Managed Care – PPO

## 2021-10-09 ENCOUNTER — Ambulatory Visit (INDEPENDENT_AMBULATORY_CARE_PROVIDER_SITE_OTHER): Payer: BC Managed Care – PPO

## 2021-10-09 ENCOUNTER — Other Ambulatory Visit: Payer: Self-pay

## 2021-10-09 ENCOUNTER — Encounter: Payer: Self-pay | Admitting: Emergency Medicine

## 2021-10-09 DIAGNOSIS — S46212A Strain of muscle, fascia and tendon of other parts of biceps, left arm, initial encounter: Secondary | ICD-10-CM | POA: Diagnosis not present

## 2021-10-09 DIAGNOSIS — M79622 Pain in left upper arm: Secondary | ICD-10-CM

## 2021-10-09 MED ORDER — METHOCARBAMOL 500 MG PO TABS: 500.0000 mg | ORAL_TABLET | Freq: Two times a day (BID) | ORAL | 0 refills | Status: DC

## 2021-10-09 MED ORDER — PREDNISONE 10 MG PO TABS: 10.0000 mg | ORAL_TABLET | Freq: Every day | ORAL | 0 refills | Status: DC

## 2021-10-09 NOTE — ED Provider Notes (Signed)
[facility]    CSN: [id number] Arrival date & time: 10/09/21  0825      History   Chief Complaint Chief Complaint  Patient presents with   Extremity Pain    left    HPI Summer Thomas is a 40 y.o. female.   Pt has lt arm upper humerus pain for 4 days. Not able to lift completely. Denies any neck pain, no injury. Took bayer with no relief. Has never had any previous injury prior. Does not remember lifting or pulling anything.    Past Medical History:  Diagnosis Date   Hypertension 2011-2013   no meds currently, no BP problems since 2013   Missed ab    x 2 - no surgery required    Patient Active Problem List   Diagnosis Date Noted   Postoperative state 02/10/2015    Past Surgical History:  Procedure Laterality Date   CERVICAL CERCLAGE  2011   CERVICAL CERCLAGE N/A 08/16/2014   Procedure: Trans Vaginal Cerclage, Vaginal Ultrasound, Modified Macdonald, Bladder Instillation Milk;  Surgeon: Loney Laurence, MD;  Location: [facility];  Service: Gynecology;  Laterality: N/A;   CERVICAL CERCLAGE N/A 02/10/2015   Procedure: CERCLAGE CERVICAL;  Surgeon: Carrington Clamp, MD;  Location: [facility];  Service: Obstetrics;  Laterality: N/A;   CESAREAN SECTION  08/2010   37 wks    CESAREAN SECTION N/A 02/10/2015   Procedure: CESAREAN SECTION , ;  Surgeon: Carrington Clamp, MD;  Location: [facility];  Service: Obstetrics;  Laterality: N/A;   WISDOM TOOTH EXTRACTION      OB History     Gravida  4   Para  2   Term  2   Preterm      AB  2   Living  1      SAB  2   IAB      Ectopic      Multiple  0   Live Births  1            Home Medications    Prior to Admission medications   Medication Sig Start Date End Date Taking? Authorizing Provider  methocarbamol (ROBAXIN) 500 MG tablet Take 1 tablet (500 mg total) by mouth 2 (two) times daily. 10/09/21  Yes Coralyn Mark, NP  predniSONE (DELTASONE) 10 MG tablet Take 1 tablet (10 mg total) by  mouth daily with breakfast. 10/09/21  Yes Coralyn Mark, NP  ALAYCEN 1/35 tablet Take 1 tablet by mouth daily. 07/21/21   [provider]  docusate sodium (COLACE) 100 MG capsule Take 100 mg by mouth daily.    [provider]  ibuprofen (ADVIL,MOTRIN) 600 MG tablet Take 1 tablet (600 mg total) by mouth every 6 (six) hours. 02/12/15   Marlow Baars, MD  metFORMIN (GLUCOPHAGE-XR) 500 MG 24 hr tablet Take 500 mg by mouth daily. 09/16/21   [provider]  oxyCODONE-acetaminophen (PERCOCET/ROXICET) 5-325 MG per tablet Take 1-2 tablets by mouth every 6 (six) hours as needed (for pain scale greater than 7). 02/12/15   Marlow Baars, MD  Prenatal Vit-Fe Fumarate-FA (PRENATAL MULTIVITAMIN) TABS tablet Take 1 tablet by mouth daily at 12 noon.    [provider]    Family History No family history on file.  Social History Social History   Tobacco Use   Smoking status: Never   Smokeless tobacco: Never  Substance Use Topics   Alcohol use: No   Drug use: No     Allergies  Penicillins   Review of Systems Review of Systems  Constitutional: Negative.   Respiratory: Negative.    Cardiovascular: Negative.   Gastrointestinal: Negative.   Musculoskeletal:  Positive for joint swelling.       Lt upper arm and front bicept pain slight swelling, no able to lift arm well     Physical Exam Triage Vital Signs ED Triage Vitals  Enc Vitals Group     BP 10/09/21 0914 131/84     Pulse Rate 10/09/21 0914 87     Resp 10/09/21 0914 18     Temp 10/09/21 0914 98.6 F (37 C)     Temp Source 10/09/21 0914 Oral     SpO2 10/09/21 0914 100 %     Weight --      Height --      Head Circumference --      Peak Flow --      Pain Score 10/09/21 0911 9     Pain Loc --      Pain Edu? --      Excl. in GC? --    No data found.  Updated Vital Signs BP 131/84 (BP Location: Right Arm)   Pulse 87   Temp 98.6 F (37 C) (Oral)   Resp 18   SpO2 100%   Breastfeeding No    Visual Acuity Right Eye Distance:   Left Eye Distance:   Bilateral Distance:    Right Eye Near:   Left Eye Near:    Bilateral Near:     Physical Exam Constitutional:      Appearance: Normal appearance.  Cardiovascular:     Rate and Rhythm: Normal rate.  Pulmonary:     Effort: Pulmonary effort is normal.  Abdominal:     General: Abdomen is flat.  Musculoskeletal:        General: Swelling and tenderness present.     Cervical back: Normal range of motion.     Comments: Decreased rom lateral due to pain at humerus and bicep area of left side. Strong pulses.   Skin:    General: Skin is warm.     Capillary Refill: Capillary refill takes less than 2 seconds.     Findings: No bruising, erythema or rash.  Neurological:     General: No focal deficit present.     Mental Status: She is alert.     UC Treatments / Results  Labs (all labs ordered are listed, but only abnormal results are displayed) Labs Reviewed - No data to display  EKG   Radiology No results found.  Procedures Procedures (including critical care time)  Medications Ordered in UC Medications - No data to display  Initial Impression / Assessment and Plan / UC Course  I have reviewed the triage vital signs and the nursing notes.  Pertinent labs & imaging results that were available during my care of the patient were reviewed by me and considered in my medical decision making (see chart for details).     NSAIDS as needed for pain  Will need to see ortho pt has [facility] and will call them  Take muscle relaxer's as needed  X  ray is neg for fracture   Final Clinical Impressions(s) / UC Diagnoses   Final diagnoses:  Biceps muscle strain, left, initial encounter     Discharge Instructions      NSAIDS as needed for pain  Will need to see ortho pt has [facility] and will call  them  Take muscle relaxer's as needed  X  ray is neg for fracture     ED  Prescriptions     Medication Sig Dispense Auth. Provider   predniSONE (DELTASONE) 10 MG tablet Take 1 tablet (10 mg total) by mouth daily with breakfast. 10 tablet Maple Mirza L, NP   methocarbamol (ROBAXIN) 500 MG tablet Take 1 tablet (500 mg total) by mouth 2 (two) times daily. 20 tablet Coralyn Mark, NP      PDMP not reviewed this encounter.   Coralyn Mark, NP 10/09/21 1035

## 2021-10-09 NOTE — Discharge Instructions (Addendum)
NSAIDS as needed for pain  Will need to see ortho pt has seen [facility] previous and will call them  Take muscle relaxer's as needed  X  ray is neg for fracture

## 2021-10-09 NOTE — ED Triage Notes (Signed)
Pt presents today with c/o of pain to left arm x 4 days. She reports waking up on Friday morning with sharp pain that radiates to hand. Denies injury.

## 2022-06-23 IMAGING — MG DIGITAL DIAGNOSTIC BILAT W/ TOMO W/ CAD
8 series · 8 of 24 positions shown · non-contrast
Comparison: Previous exam(s).

CLINICAL DATA: 39-year-old female presenting for annual bilateral
mammogram and 1 year follow-up of a probably benign left breast
mass.

EXAM:
DIGITAL DIAGNOSTIC BILATERAL MAMMOGRAM WITH TOMOSYNTHESIS AND CAD;
ULTRASOUND LEFT BREAST LIMITED
TECHNIQUE: Bilateral digital diagnostic mammography and breast tomosynthesis
was performed. The images were evaluated with computer-aided
detection.; Targeted ultrasound examination of the left breast was
performed

[L MLO synth-2D]
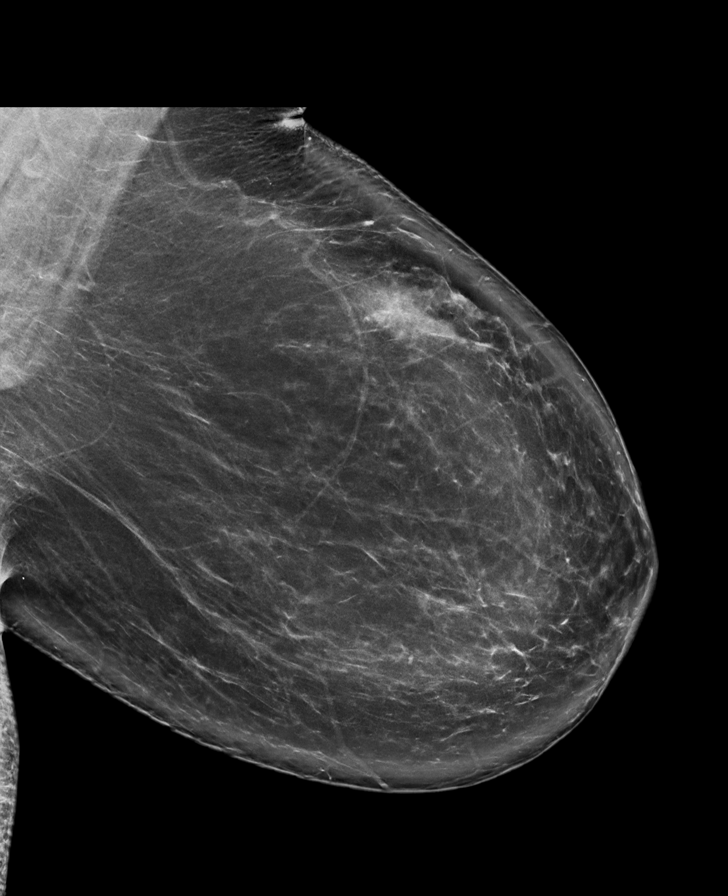

[L CC synth-2D]
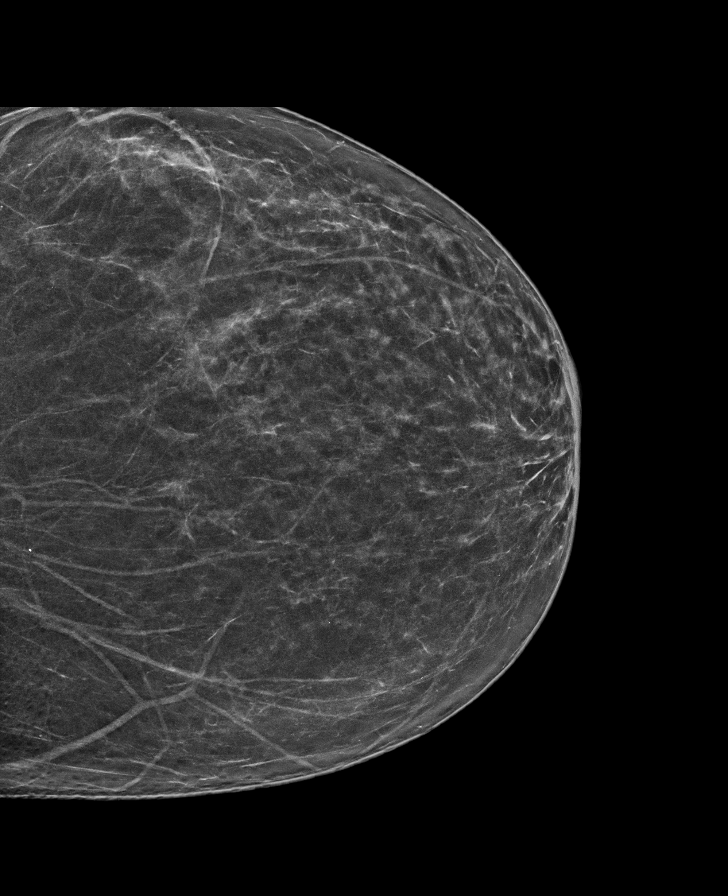

[R MLO synth-2D]
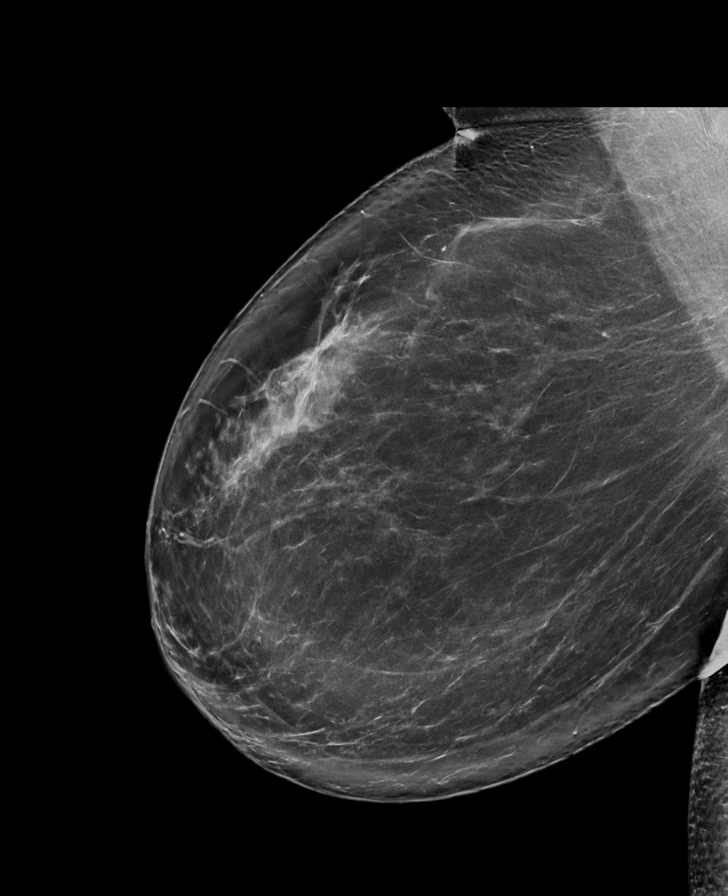

[R CC synth-2D]
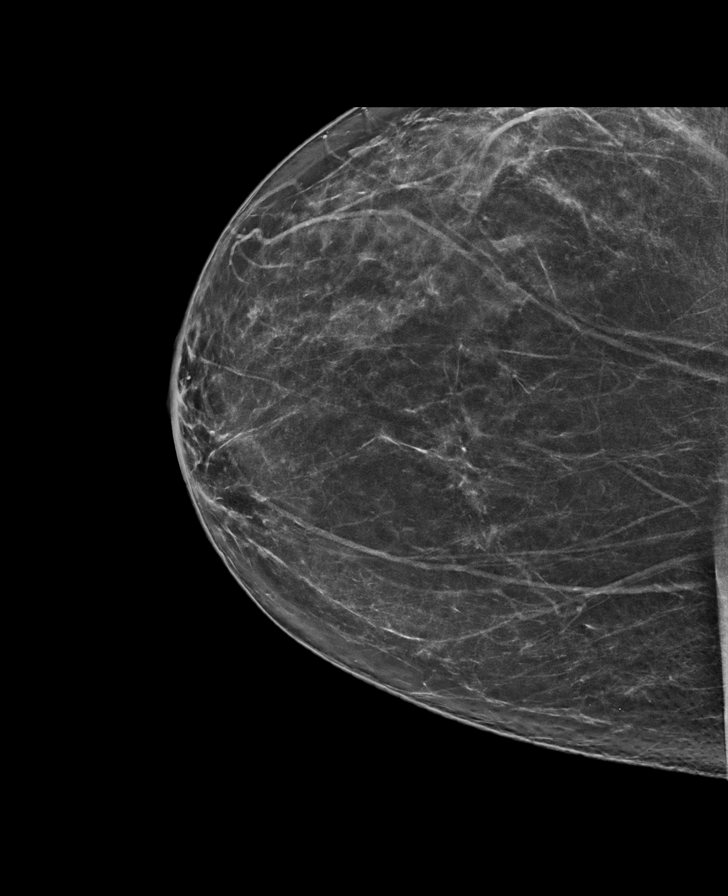

[L MLO tomo · tomo slice 50/99.0]
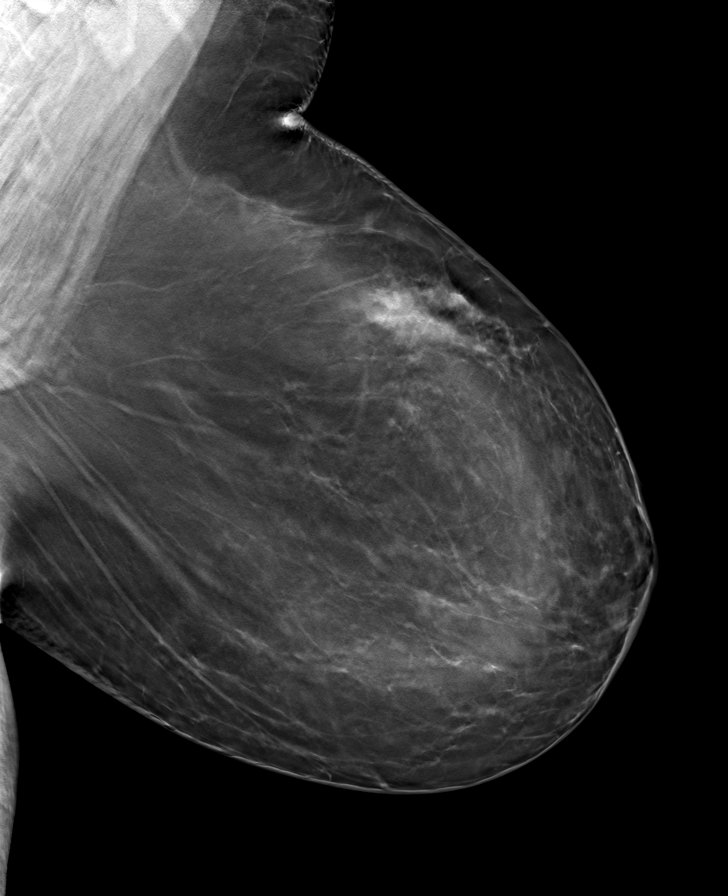

[L CC tomo · tomo slice 37/72.0]
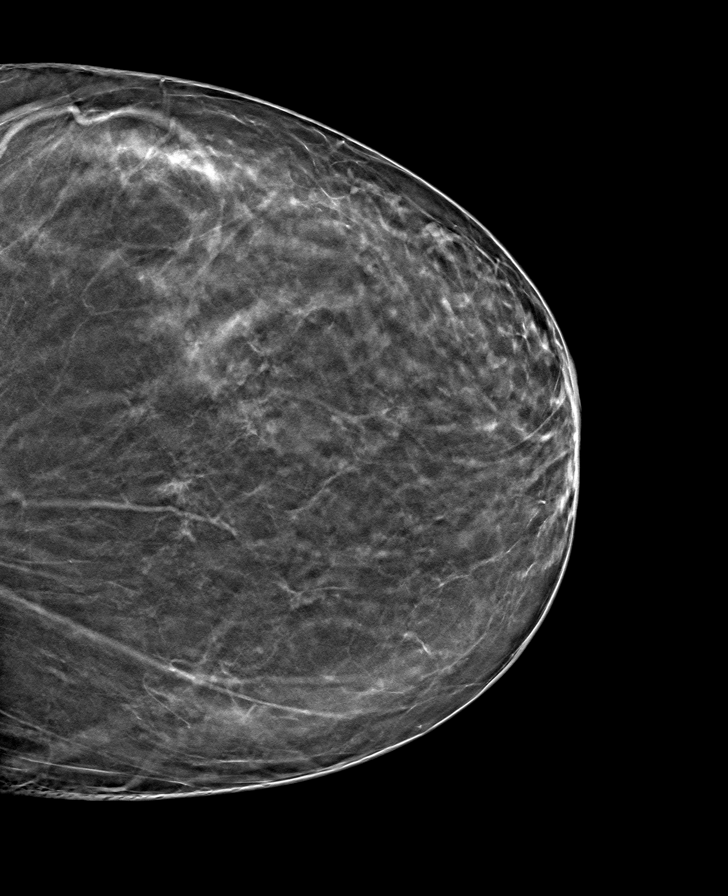

[R CC tomo · tomo slice 39/78.0]
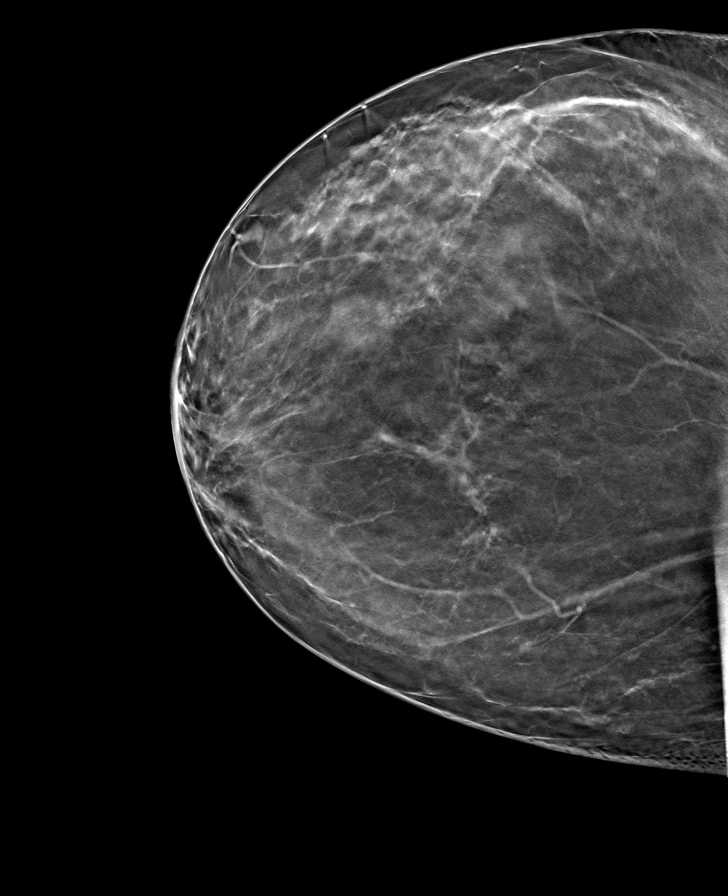

[R MLO tomo · tomo slice 49/97.0]
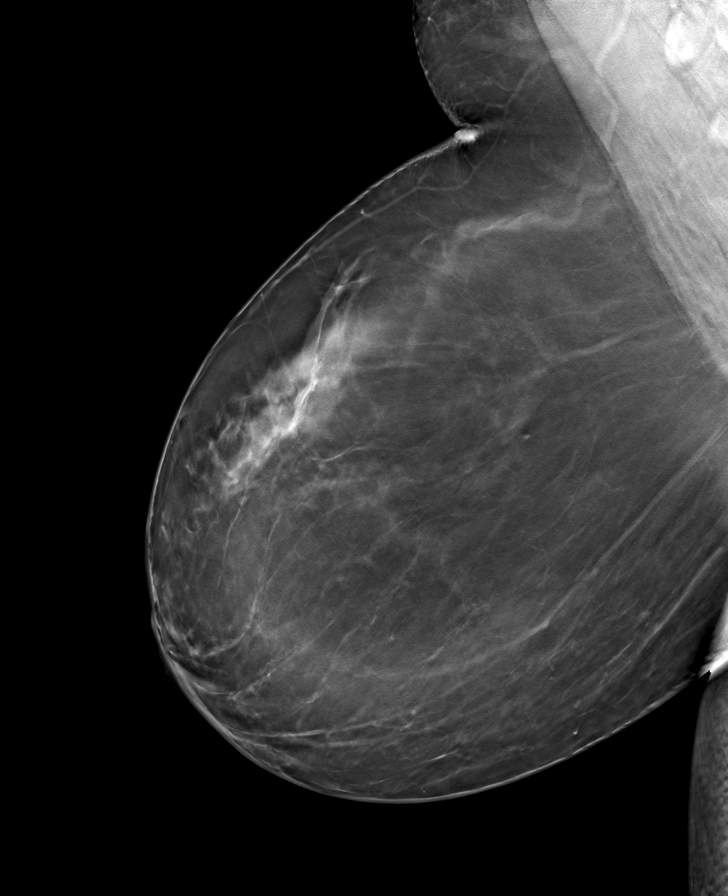

[8 of 24 positions shown; findings below may reference images not displayed]

ACR Breast Density Category b: There are scattered areas of
fibroglandular density.
FINDINGS: Previously described mass in the lower inner quadrant of the left
breast has resolved in the interim. No new or suspicious findings in
the remainder of either breast.

Targeted ultrasound is performed, showing normal fibroglandular
tissue without focal or suspicious sonographic abnormality.
Evaluation along the 8 o'clock axis was performed.
IMPRESSION: 1. Interval resolution of the previously described left breast mass.
No further imaging follow-up required.
2. No mammographic evidence of malignancy in either breast.

RECOMMENDATION:
Screening mammogram in one year.(Code:[id number])

I have discussed the findings and recommendations with the patient.
If applicable, a reminder letter will be sent to the patient
regarding the next appointment.

BI-RADS CATEGORY  1: Negative.

## 2022-07-09 ENCOUNTER — Ambulatory Visit (INDEPENDENT_AMBULATORY_CARE_PROVIDER_SITE_OTHER): Payer: 59 | Admitting: Family Medicine

## 2022-07-09 ENCOUNTER — Encounter: Payer: Self-pay | Admitting: Family Medicine

## 2022-07-09 VITALS — BP 138/78 | HR 84 | Temp 98.6°F | Ht 67.0 in | Wt 245.2 lb

## 2022-07-09 DIAGNOSIS — Z1159 Encounter for screening for other viral diseases: Secondary | ICD-10-CM | POA: Diagnosis not present

## 2022-07-09 DIAGNOSIS — M722 Plantar fascial fibromatosis: Secondary | ICD-10-CM | POA: Diagnosis not present

## 2022-07-09 DIAGNOSIS — E669 Obesity, unspecified: Secondary | ICD-10-CM

## 2022-07-09 DIAGNOSIS — D509 Iron deficiency anemia, unspecified: Secondary | ICD-10-CM | POA: Diagnosis not present

## 2022-07-09 LAB — COMPREHENSIVE METABOLIC PANEL
ALT: 7 U/L (ref 0–35)
AST: 11 U/L (ref 0–37)
Albumin: 3.9 g/dL (ref 3.5–5.2)
Alkaline Phosphatase: 47 U/L (ref 39–117)
BUN: 11 mg/dL (ref 6–23)
CO2: 25 mEq/L (ref 19–32)
Calcium: 9 mg/dL (ref 8.4–10.5)
Chloride: 103 mEq/L (ref 96–112)
Creatinine, Ser: 0.59 mg/dL (ref 0.40–1.20)
GFR: 110.85 mL/min (ref >60.00)
Glucose, Bld: 118 mg/dL — ABNORMAL HIGH (ref 70–99)
Potassium: 4.3 mEq/L (ref 3.5–5.1)
Sodium: 137 mEq/L (ref 135–145)
Total Bilirubin: 0.3 mg/dL (ref 0.2–1.2)
Total Protein: 7.1 g/dL (ref 6.0–8.3)

## 2022-07-09 LAB — CBC
HCT: 40.7 % (ref 36.0–46.0)
Hemoglobin: 13.2 g/dL (ref 12.0–15.0)
MCHC: 32.3 g/dL (ref 30.0–36.0)
MCV: 93.6 fl (ref 78.0–100.0)
Platelets: 348 10*3/uL (ref 150.0–400.0)
RBC: 4.35 Mil/uL (ref 3.87–5.11)
RDW: 13.9 % (ref 11.5–15.5)
WBC: 7.8 10*3/uL (ref 4.0–10.5)

## 2022-07-09 LAB — TSH: TSH: 0.38 u[IU]/mL (ref 0.35–5.50)

## 2022-07-09 LAB — IBC + FERRITIN
Ferritin: 75.9 ng/mL (ref 10.0–291.0)
Iron: 89 ug/dL (ref 42–145)
Saturation Ratios: 20.6 % (ref 20.0–50.0)
TIBC: 432.6 ug/dL (ref 250.0–450.0)
Transferrin: 309 mg/dL (ref 212.0–360.0)

## 2022-07-09 LAB — VITAMIN B12: Vitamin B-12: 198 pg/mL — ABNORMAL LOW (ref 211–911)

## 2022-07-09 LAB — LIPID PANEL
Cholesterol: 169 mg/dL (ref 0–200)
HDL: 59.1 mg/dL (ref >39.00)
LDL Cholesterol: 76 mg/dL (ref 0–99)
NonHDL: 109.84
Total CHOL/HDL Ratio: 3
Triglycerides: 170 mg/dL — ABNORMAL HIGH (ref 0.0–149.0)
VLDL: 34 mg/dL (ref 0.0–40.0)

## 2022-07-09 LAB — VITAMIN D 25 HYDROXY (VIT D DEFICIENCY, FRACTURES): VITD: 29.16 ng/mL — ABNORMAL LOW (ref 30.00–100.00)

## 2022-07-09 LAB — HEMOGLOBIN A1C: Hgb A1c MFr Bld: 5.7 % (ref 4.6–6.5)

## 2022-07-09 NOTE — Progress Notes (Signed)
    SUBJECTIVE:   CHIEF COMPLAINT / HPI: to establish care  Patient presents clinic to establish care with new PCP  Right foot pain Has had similar pain in past.  Reports increased exercising recently.  Now having right foot pain in heel and plantar surface of foot. Tried over-the-counter medications and stretching without relief.  History of flatfoot.  Reports having appointment with podiatry on Thursday.  Weight management Recently started exercising again for weight management.  Would like to continue further discussion about weight loss.  Was started on metformin 500 mg by OB/GYN for weight loss.  Stopped taking medication recently as she has lost 40 pounds total since last year.    PERTINENT  PMH / PSH:  Gestational hypertension first pregnancy Prediabetes  Mom, diabetes type 1, hypertension, osteoarthritis Dad, throat cancer secondary to smoking, ALS, DM  No tobacco use EtOH social No THC/cocaine/heroin use  Last Pap 2019, normal/HPV negative, follows with GYN in [facility] Mammogram scheduled for next week  OBJECTIVE:   BP 138/78 (BP Location: Left Arm, Patient Position: Sitting, Cuff Size: Normal)   Pulse 84   Temp 98.6 F (37 C) (Oral)   Ht 5\' 7"  (1.702 m)   Wt 245 lb 3.2 oz (111.2 kg)   SpO2 99%   BMI 38.40 kg/m    General: Alert, no acute distress Cardio: Normal S1 and S2, RRR, no r/m/g Pulm: CTAB, normal work of breathing Abdomen: Bowel sounds normal. Abdomen soft and non-tender.  Extremities: No peripheral edema.  Right foot exam: Pain along right heal and medially at plantar fascia insertion site.   Negative Thompson test. FROM. Sensation, motor and gait intact.   ASSESSMENT/PLAN:   Plantar fasciitis of right foot Previous history of fascitis. Tylenol and/or aleve as needed for pain  Plantar fascia stretch for 20-30 seconds (do 3 of these) in morning Lowering/raise on a step exercises 3 x 10 once or twice a day - this is very important for long term  recovery. Can add heel walks, toe walks forward and backward as well Ice heel for 15 minutes as needed. Avoid flat shoes/barefoot walking as much as possible. Arch straps have been shown to help with pain. Inserts are important (dr. active series, spencos, our green insoles, custom orthotics). Steroid injection is a consideration for short term pain relief if you are struggling. Physical therapy is also an option. Follow up with Podiatry as scheduled  Obesity, Class II, BMI 35-39.9 BMI elevated.  Recently started exercising.  Would like to continue with current plan of diet and exercise.  Discussed medications offered for weight loss management.  Patient would like to hold off on medications at this time.  Also provided patient with healthy weight loss management clinic. -We will continue to discuss weight loss management at each visit. -CBC, TSH, vitamin D, vitamin B12, CMET, lipids, A1c today  -Follow-up in 4 weeks.  Encounter for hepatitis C screening test for low risk patient Hepatitis C screening labs today  Anemia Reviewed last hemoglobin 10.7.  Currently asymptomatic.  -Repeat CBC, iron panel today    Pap smear due 2024, followed by OBGYN Mammogram scheduled for later next week Colonoscopy at age 36-50   PDMP reviewed  56-49, MD

## 2022-07-09 NOTE — Patient Instructions (Addendum)
It was a pleasure meeting you today. Thank you for allowing me to take part in your health care.  Our goals for today as we discussed include:  We will get some labs today.  If they are abnormal or we need to do something about them, I will call you.  If they are normal, I will send you a message on MyChart (if it is active) or a letter in the mail.  If you don't hear from [place] in 2 weeks, please call the office at the number below.   Referral sent for Mammogram. Please call to schedule appointment. [facility] [address] [place], [place] [address] [phone]    For your plantar fasciitis Take tylenol and/or aleve as needed for pain  Plantar fascia stretch for 20-30 seconds (do 3 of these) in morning Lowering/raise on a step exercises 3 x 10 once or twice a day - this is very important for long term recovery. Can add heel walks, toe walks forward and backward as well Ice heel for 15 minutes as needed. Avoid flat shoes/barefoot walking as much as possible. Arch straps have been shown to help with pain. Inserts are important (dr. Jari Sportsman active series, spencos, our green insoles, custom orthotics). Steroid injection is a consideration for short term pain relief if you are struggling. Physical therapy is also an option.  Look at [facility] website to see if this may be an option for you with your weight loss. [facility] [address] [place] [phone]  Please follow-up with PCP in 4 weeks  If you have any questions or concerns, please do not hesitate to call the office at [phone].  I look forward to our next visit and until then take care and stay safe.  Regards,   Dana Allan, MD   [facility]

## 2022-07-10 LAB — HEPATITIS C ANTIBODY: Hepatitis C Ab: NONREACTIVE

## 2022-07-11 ENCOUNTER — Telehealth: Payer: Self-pay

## 2022-07-11 NOTE — Telephone Encounter (Signed)
Spoke to the Patient with her lab results and Dr. Walsh's recommendations.Patient verbalized understanding 

## 2022-07-11 NOTE — Progress Notes (Signed)
Spoke to the Patient with her lab results and Dr. Claris Che recommendations.Patient verbalized understanding

## 2022-07-12 ENCOUNTER — Encounter: Payer: Self-pay | Admitting: Podiatry

## 2022-07-12 ENCOUNTER — Ambulatory Visit (INDEPENDENT_AMBULATORY_CARE_PROVIDER_SITE_OTHER): Payer: 59 | Admitting: Podiatry

## 2022-07-12 ENCOUNTER — Ambulatory Visit (INDEPENDENT_AMBULATORY_CARE_PROVIDER_SITE_OTHER): Payer: 59

## 2022-07-12 DIAGNOSIS — M722 Plantar fascial fibromatosis: Secondary | ICD-10-CM | POA: Diagnosis not present

## 2022-07-12 NOTE — Progress Notes (Signed)
  Subjective:  Patient ID: Summer Thomas, female    DOB: 06/13/1981,  MRN: [id number]  Chief Complaint  Patient presents with   Plantar Fasciitis    Patient complains of right heel pain that she has had for years.    41 y.o. female presents with the above complaint.  Patient presents with right heel pain that has been on for years has progressive gotten worse.  Now is getting more painful.  She has not seen anyone else prior to seeing me hurts with ambulation pain scale 7 out of 10 hurts with pressure she has not seen anyone else.  She is tried some over-the-counter options including stretching none of which has helped.  She denies any other acute complaints.   Review of Systems: Negative except as noted in the HPI. Denies N/V/F/Ch.  Past Medical History:  Diagnosis Date   Hypertension 2011-2013   no meds currently, no BP problems since 2013   Missed ab    x 2 - no surgery required    Current Outpatient Medications:    ALAYCEN 1/35 tablet, Take 1 tablet by mouth daily., Disp: , Rfl:   Social History   Tobacco Use  Smoking Status Never  Smokeless Tobacco Never    Allergies  Allergen Reactions   Penicillins Rash    Childhood rxn   Objective:  There were no vitals filed for this visit. There is no height or weight on file to calculate BMI. Constitutional Well developed. Well nourished.  Vascular Dorsalis pedis pulses palpable bilaterally. Posterior tibial pulses palpable bilaterally. Capillary refill normal to all digits.  No cyanosis or clubbing noted. Pedal hair growth normal.  Neurologic Normal speech. Oriented to person, place, and time. Epicritic sensation to light touch grossly present bilaterally.  Dermatologic Nails well groomed and normal in appearance. No open wounds. No skin lesions.  Orthopedic: Normal joint ROM without pain or crepitus bilaterally. No visible deformities. Tender to palpation at the calcaneal tuber right. No pain with calcaneal  squeeze right. Ankle ROM diminished range of motion right. Silfverskiold Test: positive right.   Radiographs: Taken and reviewed. No acute fractures or dislocations. No evidence of stress fracture.  Plantar heel spur present. Posterior heel spur absent.  Pes planovalgus foot structure  Assessment:   1. Plantar fasciitis of right foot    Plan:  Patient was evaluated and treated and all questions answered.  Plantar Fasciitis, right - XR reviewed as above.  - Educated on icing and stretching. Instructions given.  - Injection delivered to the plantar fascia as below. - DME: Plantar fascial brace dispensed to support the medial longitudinal arch of the foot and offload pressure from the heel and prevent arch collapse during weightbearing - Pharmacologic management: None  Procedure: Injection Tendon/Ligament Location: Right plantar fascia at the glabrous junction; medial approach. Skin Prep: alcohol Injectate: 0.5 cc 0.5% marcaine plain, 0.5 cc of 1% Lidocaine, 0.5 cc kenalog 10. Disposition: Patient tolerated procedure well. Injection site dressed with a band-aid.  No follow-ups on file.

## 2022-07-15 ENCOUNTER — Encounter: Payer: Self-pay | Admitting: Family Medicine

## 2022-07-15 DIAGNOSIS — M722 Plantar fascial fibromatosis: Secondary | ICD-10-CM | POA: Insufficient documentation

## 2022-07-15 DIAGNOSIS — E669 Obesity, unspecified: Secondary | ICD-10-CM | POA: Insufficient documentation

## 2022-07-15 DIAGNOSIS — D649 Anemia, unspecified: Secondary | ICD-10-CM | POA: Insufficient documentation

## 2022-07-15 DIAGNOSIS — Z1159 Encounter for screening for other viral diseases: Secondary | ICD-10-CM | POA: Insufficient documentation

## 2022-07-15 NOTE — Assessment & Plan Note (Signed)
Hepatitis C screening labs today

## 2022-07-15 NOTE — Assessment & Plan Note (Signed)
Reviewed last hemoglobin 10.7.  Currently asymptomatic.  -Repeat CBC, iron panel today

## 2022-07-15 NOTE — Assessment & Plan Note (Addendum)
Previous history of fascitis. Tylenol and/or aleve as needed for pain  Plantar fascia stretch for 20-30 seconds (do 3 of these) in morning Lowering/raise on a step exercises 3 x 10 once or twice a day - this is very important for long term recovery. Can add heel walks, toe walks forward and backward as well Ice heel for 15 minutes as needed. Avoid flat shoes/barefoot walking as much as possible. Arch straps have been shown to help with pain. Inserts are important (dr. Jari Sportsman active series, spencos, our green insoles, custom orthotics). Steroid injection is a consideration for short term pain relief if you are struggling. Physical therapy is also an option. Follow up with Podiatry as scheduled

## 2022-07-15 NOTE — Assessment & Plan Note (Signed)
BMI elevated.  Recently started exercising.  Would like to continue with current plan of diet and exercise.  Discussed medications offered for weight loss management.  Patient would like to hold off on medications at this time.  Also provided patient with healthy weight loss management clinic. -We will continue to discuss weight loss management at each visit. -CBC, TSH, vitamin D, vitamin B12, CMET, lipids, A1c today  -Follow-up in 4 weeks.

## 2022-08-06 ENCOUNTER — Ambulatory Visit: Payer: 59 | Admitting: Family Medicine

## 2022-08-14 ENCOUNTER — Ambulatory Visit (INDEPENDENT_AMBULATORY_CARE_PROVIDER_SITE_OTHER): Payer: 59 | Admitting: Podiatry

## 2022-08-14 DIAGNOSIS — M7731 Calcaneal spur, right foot: Secondary | ICD-10-CM | POA: Diagnosis not present

## 2022-08-14 DIAGNOSIS — M722 Plantar fascial fibromatosis: Secondary | ICD-10-CM | POA: Diagnosis not present

## 2022-08-21 ENCOUNTER — Encounter: Payer: Self-pay | Admitting: Podiatry

## 2022-08-21 NOTE — Progress Notes (Signed)
  Subjective:  Patient ID: Summer Thomas, female    DOB: 11/16/1981,  MRN: [id number]  No chief complaint on file.   41 y.o. female presents with the above complaint.  Patient presents with follow-up of right Planter fasciitis.  She states she is doing a lot better.  The injections has helped considerably.  She would like to discuss next treatment plan.  She still has some residual pain   Review of Systems: Negative except as noted in the HPI. Denies N/V/F/Ch.  Past Medical History:  Diagnosis Date   Hypertension 2011-2013   no meds currently, no BP problems since 2013   Missed ab    x 2 - no surgery required   Postoperative state 02/10/2015    Current Outpatient Medications:    ALAYCEN 1/35 tablet, Take 1 tablet by mouth daily., Disp: , Rfl:   Social History   Tobacco Use  Smoking Status Never  Smokeless Tobacco Never    Allergies  Allergen Reactions   Penicillins Rash    Childhood rxn   Objective:  There were no vitals filed for this visit. There is no height or weight on file to calculate BMI. Constitutional Well developed. Well nourished.  Vascular Dorsalis pedis pulses palpable bilaterally. Posterior tibial pulses palpable bilaterally. Capillary refill normal to all digits.  No cyanosis or clubbing noted. Pedal hair growth normal.  Neurologic Normal speech. Oriented to person, place, and time. Epicritic sensation to light touch grossly present bilaterally.  Dermatologic Nails well groomed and normal in appearance. No open wounds. No skin lesions.  Orthopedic: Normal joint ROM without pain or crepitus bilaterally. No visible deformities. Tender to palpation at the calcaneal tuber right. No pain with calcaneal squeeze right. Ankle ROM diminished range of motion right. Silfverskiold Test: positive right.   Radiographs: Taken and reviewed. No acute fractures or dislocations. No evidence of stress fracture.  Plantar heel spur present. Posterior heel  spur absent.  Pes planovalgus foot structure  Assessment:   1. Plantar fasciitis of right foot   2. Heel spur, right     Plan:  Patient was evaluated and treated and all questions answered.  Plantar Fasciitis, right with underlying heel spur - XR reviewed as above.  - Educated on icing and stretching. Instructions given.  -Second injection delivered to the plantar fascia as below. - DME: Continue plantar fascial brace.  Night splint was also dispensed - Pharmacologic management: None  Procedure: Injection Tendon/Ligament Location: Right plantar fascia at the glabrous junction; medial approach. Skin Prep: alcohol Injectate: 0.5 cc 0.5% marcaine plain, 0.5 cc of 1% Lidocaine, 0.5 cc kenalog 10. Disposition: Patient tolerated procedure well. Injection site dressed with a band-aid.  No follow-ups on file.

## 2022-09-24 ENCOUNTER — Encounter: Payer: Self-pay | Admitting: Family Medicine

## 2022-09-24 ENCOUNTER — Telehealth (INDEPENDENT_AMBULATORY_CARE_PROVIDER_SITE_OTHER): Payer: 59 | Admitting: Family Medicine

## 2022-09-24 DIAGNOSIS — Z1152 Encounter for screening for COVID-19: Secondary | ICD-10-CM

## 2022-09-24 DIAGNOSIS — J069 Acute upper respiratory infection, unspecified: Secondary | ICD-10-CM

## 2022-09-24 LAB — POC COVID19 BINAXNOW: SARS Coronavirus 2 Ag: NEGATIVE

## 2022-09-24 MED ORDER — TRIAMCINOLONE ACETONIDE 55 MCG/ACT NA AERO: 2.0000 | INHALATION_SPRAY | Freq: Every day | NASAL | Status: DC

## 2022-09-24 MED ORDER — CETIRIZINE HCL 10 MG PO TABS: 10.0000 mg | ORAL_TABLET | Freq: Every day | ORAL | 11 refills | Status: DC

## 2022-09-24 NOTE — Progress Notes (Signed)
[facility] Telemedicine Visit  Patient consented to have virtual visit and was identified by name and date of birth. Method of visit: Video  Encounter participants: Patient: Summer Thomas - located at home Provider: Carollee Leitz - located at office Others (if applicable): none  Chief Complaint: Flu like symptoms  HPI:  Symptoms started 2 nights ago.  Endorses nasal congestion, dry cough, sneezing, feeling weak with some muscle aches, nasal congestion, loss of taste and smell, intermittent nausea and mild headache that is relieved with Mucinex.  Denies any shortness of breath, chest pain, vomiting, diarrhea, fevers, recent sick contacts.  Appetite good and hydrating well.   ROS: per HPI  Pertinent PMHx:  Seasonal allergies  Exam:  There were no vitals taken for this visit.  Respiratory: Speaks in full sentences.  No increased work of breathing, cough or wheezing.  Assessment/Plan:  URI (upper respiratory infection) 2 nights of symptoms. Given relief with Mucinex likely viral in nature.  Allergen also possible contributing to symptoms.  No acute respiratory distress -COVID test negative -Symptomatic management for fever, muscle aches and headaches  -Tylenol 325-500 mg every 6 hours as needed -Ibuprofen 200 mg every 8 hours as needed -Start Zyrtec 10 mg daily  -Continue Nasocort 2 sprays daily -Can use Humidifier -Stay well hydrated -Follow up if no improvement in symptoms in 3-4 days   PDMP Reviewed

## 2022-09-24 NOTE — Patient Instructions (Signed)
It was a pleasure meeting you today. Thank you for allowing me to take part in your health care.  Our goals for today as we discussed include:  For your upper respiratory infection COVID test negative. Symptomatic management for fever, muscle aches and headaches  -Tylenol 325-500 mg every 6 hours as needed -Ibuprofen 200 mg every 8 hours as needed -Start Zyrtec 10 mg daily  -Continue Nasocort 2 sprays daily -Can use Humidifier  Stay well hydrated  MyChart MD if not improving in 3-4 days and will send in prescription for antibiotics for sinusitis  If you have worsening symptoms, especially difficulty breathing please call 911 or have someone take you to the emergency department.    Please follow-up with PCP as needed  If you have any questions or concerns, please do not hesitate to call the office at [phone].  I look forward to our next visit and until then take care and stay safe.  Regards,   Carollee Leitz, MD   [facility]

## 2022-09-26 HISTORY — PX: HYSTEROSCOPY WITH D & C: SHX1775

## 2022-10-01 DIAGNOSIS — R051 Acute cough: Secondary | ICD-10-CM | POA: Diagnosis not present

## 2022-10-02 ENCOUNTER — Ambulatory Visit: Payer: 59 | Admitting: Family Medicine

## 2022-10-07 ENCOUNTER — Encounter: Payer: Self-pay | Admitting: Family Medicine

## 2022-10-07 DIAGNOSIS — J029 Acute pharyngitis, unspecified: Secondary | ICD-10-CM | POA: Insufficient documentation

## 2022-10-07 DIAGNOSIS — J069 Acute upper respiratory infection, unspecified: Secondary | ICD-10-CM | POA: Insufficient documentation

## 2022-10-07 NOTE — Assessment & Plan Note (Signed)
2 nights of symptoms. Given relief with Mucinex likely viral in nature.  Allergen also possible contributing to symptoms.  No acute respiratory distress -COVID test negative -Symptomatic management for fever, muscle aches and headaches  -Tylenol 325-500 mg every 6 hours as needed -Ibuprofen 200 mg every 8 hours as needed -Start Zyrtec 10 mg daily  -Continue Nasocort 2 sprays daily -Can use Humidifier -Stay well hydrated -Follow up if no improvement in symptoms in 3-4 days

## 2022-10-28 ENCOUNTER — Telehealth: Payer: 59

## 2022-10-30 ENCOUNTER — Ambulatory Visit: Payer: 59 | Admitting: Family Medicine

## 2022-12-01 ENCOUNTER — Telehealth: Payer: 59 | Admitting: Nurse Practitioner

## 2022-12-01 DIAGNOSIS — J0100 Acute maxillary sinusitis, unspecified: Secondary | ICD-10-CM

## 2022-12-01 MED ORDER — FLUTICASONE PROPIONATE 50 MCG/ACT NA SUSP: 2.0000 | Freq: Every day | NASAL | 6 refills | Status: DC

## 2022-12-01 MED ORDER — AZITHROMYCIN 250 MG PO TABS: ORAL_TABLET | ORAL | 0 refills | Status: DC

## 2022-12-01 MED ORDER — FLUTICASONE PROPIONATE 50 MCG/ACT NA SUSP: 2.0000 | Freq: Every day | NASAL | 6 refills | Status: AC

## 2022-12-01 NOTE — Patient Instructions (Signed)
Augustin Schooling, thank you for joining Chevis Pretty, FNP for today's virtual visit.  While this provider is not your primary care provider (PCP), if your PCP is located in our provider database this encounter information will be shared with them immediately following your visit.   [place] account gives you access to today's visit and all your visits, tests, and labs performed at [facility] " click here if you don't have a [facility] account or go to [email or web address]  Consent: (Patient) Summer Thomas [facility] provided verbal consent for this virtual visit at the beginning of the encounter.  Current Medications:  Current Outpatient Medications:    ALAYCEN 1/35 tablet, Take 1 tablet by mouth daily., Disp: , Rfl:    azithromycin (ZITHROMAX Z-PAK) 250 MG tablet, As directed, Disp: 6 tablet, Rfl: 0   cetirizine (ZYRTEC) 10 MG tablet, Take 1 tablet (10 mg total) by mouth daily., Disp: 30 tablet, Rfl: 11   fluticasone (FLONASE) 50 MCG/ACT nasal spray, Place 2 sprays into both nostrils daily., Disp: 16 g, Rfl: 6   triamcinolone (NASACORT) 55 MCG/ACT AERO nasal inhaler, Place 2 sprays into the nose daily., Disp: , Rfl:    Medications ordered in this encounter:  Meds ordered this encounter  Medications   DISCONTD: fluticasone (FLONASE) 50 MCG/ACT nasal spray    Sig: Place 2 sprays into both nostrils daily.    Dispense:  16 g    Refill:  6    Order Specific Question:   Supervising Provider    Answer:   Chase Picket [[id number]]   DISCONTD: azithromycin (ZITHROMAX Z-PAK) 250 MG tablet    Sig: As directed    Dispense:  6 tablet    Refill:  0    Order Specific Question:   Supervising Provider    Answer:   Chase Picket [[id number]]   azithromycin (ZITHROMAX Z-PAK) 250 MG tablet    Sig: As directed    Dispense:  6 tablet    Refill:  0    Order Specific Question:   Supervising Provider    Answer:   Chase Picket [[id number]]    fluticasone (FLONASE) 50 MCG/ACT nasal spray    Sig: Place 2 sprays into both nostrils daily.    Dispense:  16 g    Refill:  6    Order Specific Question:   Supervising Provider    Answer:   Chase Picket [id number]     *If you need refills on other medications prior to your next appointment, please contact your pharmacy*  Follow-Up: Call back or seek an in-person evaluation if the symptoms worsen or if the condition fails to improve as anticipated.  [place] [phone]  Other Instructions 1. Take meds as prescribed 2. Use a cool mist humidifier especially during the winter months and when heat has been humid. 3. Use saline nose sprays frequently 4. Saline irrigations of the nose can be very helpful if done frequently.  * 4X daily for 1 week*  * Use of a nettie pot can be helpful with this. Follow directions with this* 5. Drink plenty of fluids 6. Keep thermostat turn down low 7.For any cough or congestion- mucinex if needed 8. For fever or aces or pains- take tylenol or ibuprofen appropriate for age and weight.  * for fevers greater than 101 orally you may alternate ibuprofen and tylenol every  3 hours.    AVOID AFRIN  If you have been instructed to  have an in-person evaluation today at a local Urgent Care facility, please use the link below. It will take you to a list of all of our available [facility], including address, phone number and hours of operation. Please do not delay care.  [facility]  If you or a family member do not have a primary care provider, use the link below to schedule a visit and establish care. When you choose a [facility] primary care physician or advanced practice provider, you gain a long-term partner in health. Find a Primary Care Provider  Learn more about [facility]'s in-office and virtual care options: [facility]

## 2022-12-01 NOTE — Progress Notes (Signed)
Virtual Visit Consent   Hope Brandenburger [facility], you are scheduled for a virtual visit with Mary-Margaret Hassell Done, [place], a [facility] provider, today.     Just as with appointments in the office, your consent must be obtained to participate.  Your consent will be active for this visit and any virtual visit you may have with one of our providers in the next 365 days.     If you have a MyChart account, a copy of this consent can be sent to you electronically.  All virtual visits are billed to your insurance company just like a traditional visit in the office.    As this is a virtual visit, video technology does not allow for your provider to perform a traditional examination.  This may limit your provider's ability to fully assess your condition.  If your provider identifies any concerns that need to be evaluated in person or the need to arrange testing (such as labs, EKG, etc.), we will make arrangements to do so.     Although advances in technology are sophisticated, we cannot ensure that it will always work on either your end or our end.  If the connection with a video visit is poor, the visit may have to be switched to a telephone visit.  With either a video or telephone visit, we are not always able to ensure that we have a secure connection.     I need to obtain your verbal consent now.   Are you willing to proceed with your visit today? YES   Eh Sauseda has provided verbal consent on 12/01/2022 for a virtual visit (video or telephone).   Mary-Margaret Hassell Done, FNP   Date: 12/01/2022 12:23 PM   Virtual Visit via Video Note   I, Mary-Margaret Hassell Done, connected with Summer Thomas ([id number], 44-Aug-1982) on 12/01/22 at 12:15 PM EST by a video-enabled telemedicine application and verified that I am speaking with the correct person using two identifiers.  Location: Patient: Virtual Visit Location Patient: Home Provider: Virtual Visit Location Provider: Mobile   I discussed  the limitations of evaluation and management by telemedicine and the availability of in person appointments. The patient expressed understanding and agreed to proceed.    History of Present Illness: Summer Thomas is a 42 y.o. who identifies as a female who was assigned female at birth, and is being seen today for sinusitis.  HPI: Had covid 2 1/2 weeks ago. Did not take antiviral . Only symptom she had was a stuffy nose. Cannot get rid of stuffy nose despite OTC meds ( afrin, tylenol and motrin). Has cough mainly at night.     Review of Systems  Constitutional:  Negative for chills and fever.  HENT:  Positive for congestion and sinus pain. Negative for ear discharge, ear pain and sore throat.   Respiratory:  Positive for cough. Negative for sputum production and shortness of breath.   Neurological:  Negative for dizziness and headaches.    Problems:  Patient Active Problem List   Diagnosis Date Noted   URI (upper respiratory infection) 10/07/2022   Plantar fasciitis of right foot 07/15/2022   Obesity, Class II, BMI 35-39.9 07/15/2022   Encounter for hepatitis C screening test for low risk patient 07/15/2022   Anemia 07/15/2022    Allergies:  Allergies  Allergen Reactions   Penicillins Rash    Childhood rxn   Medications:  Current Outpatient Medications:    ALAYCEN 1/35 tablet, Take 1 tablet by mouth daily., Disp: ,  Rfl:    cetirizine (ZYRTEC) 10 MG tablet, Take 1 tablet (10 mg total) by mouth daily., Disp: 30 tablet, Rfl: 11   triamcinolone (NASACORT) 55 MCG/ACT AERO nasal inhaler, Place 2 sprays into the nose daily., Disp: , Rfl:   Observations/Objective: Patient is well-developed, well-nourished in no acute distress.  Resting comfortably  at home.  Head is normocephalic, atraumatic.  No labored breathing.  Speech is clear and coherent with logical content.  Patient is alert and oriented at baseline.  Raspy voice Maxillary facial pressure  Assessment and  Plan:  Georgeanne Frankland [facility] in today with chief complaint of Cough   1. Acute non-recurrent maxillary sinusitis Avoid afrin 1. Take meds as prescribed 2. Use a cool mist humidifier especially during the winter months and when heat has been humid. 3. Use saline nose sprays frequently 4. Saline irrigations of the nose can be very helpful if done frequently.  * 4X daily for 1 week*  * Use of a nettie pot can be helpful with this. Follow directions with this* 5. Drink plenty of fluids 6. Keep thermostat turn down low 7.For any cough or congestion- mucinex if needed 8. For fever or aces or pains- take tylenol or ibuprofen appropriate for age and weight.  * for fevers greater than 101 orally you may alternate ibuprofen and tylenol every  3 hours.   Meds ordered this encounter  Medications   DISCONTD: fluticasone (FLONASE) 50 MCG/ACT nasal spray    Sig: Place 2 sprays into both nostrils daily.    Dispense:  16 g    Refill:  6    Order Specific Question:   Supervising Provider    Answer:   Merrilee Jansky [[id number]]   DISCONTD: azithromycin (ZITHROMAX Z-PAK) 250 MG tablet    Sig: As directed    Dispense:  6 tablet    Refill:  0    Order Specific Question:   Supervising Provider    Answer:   Merrilee Jansky [[id number]]   azithromycin (ZITHROMAX Z-PAK) 250 MG tablet    Sig: As directed    Dispense:  6 tablet    Refill:  0    Order Specific Question:   Supervising Provider    Answer:   Merrilee Jansky [[id number]]   fluticasone (FLONASE) 50 MCG/ACT nasal spray    Sig: Place 2 sprays into both nostrils daily.    Dispense:  16 g    Refill:  6    Order Specific Question:   Supervising Provider    Answer:   Merrilee Jansky [id number]       Follow Up Instructions: I discussed the assessment and treatment plan with the patient. The patient was provided an opportunity to ask questions and all were answered. The patient agreed with the plan and demonstrated an understanding of the  instructions.  A copy of instructions were sent to the patient via MyChart.  The patient was advised to call back or seek an in-person evaluation if the symptoms worsen or if the condition fails to improve as anticipated.  Time:  I spent 6 minutes with the patient via telehealth technology discussing the above problems/concerns.    Mary-Margaret Daphine Deutscher, FNP

## 2023-01-28 ENCOUNTER — Ambulatory Visit: Payer: 59

## 2023-01-28 ENCOUNTER — Encounter: Payer: Self-pay | Admitting: Family

## 2023-01-28 ENCOUNTER — Ambulatory Visit: Payer: 59 | Admitting: Family

## 2023-01-28 ENCOUNTER — Ambulatory Visit (INDEPENDENT_AMBULATORY_CARE_PROVIDER_SITE_OTHER): Payer: 59 | Admitting: Family

## 2023-01-28 VITALS — BP 118/80 | HR 93 | Temp 98.3°F | Ht 67.0 in | Wt 250.4 lb

## 2023-01-28 DIAGNOSIS — R110 Nausea: Secondary | ICD-10-CM

## 2023-01-28 DIAGNOSIS — J029 Acute pharyngitis, unspecified: Secondary | ICD-10-CM

## 2023-01-28 DIAGNOSIS — R509 Fever, unspecified: Secondary | ICD-10-CM | POA: Diagnosis not present

## 2023-01-28 LAB — POCT INFLUENZA A/B
Influenza A, POC: NEGATIVE
Influenza B, POC: NEGATIVE

## 2023-01-28 LAB — POC COVID19 BINAXNOW: SARS Coronavirus 2 Ag: NEGATIVE

## 2023-01-28 LAB — POCT RAPID STREP A (OFFICE): Rapid Strep A Screen: NEGATIVE

## 2023-01-28 MED ORDER — ONDANSETRON HCL 4 MG PO TABS: 4.0000 mg | ORAL_TABLET | Freq: Three times a day (TID) | ORAL | 0 refills | Status: DC | PRN

## 2023-01-28 MED ORDER — PREDNISONE 10 MG PO TABS: ORAL_TABLET | ORAL | 0 refills | Status: DC

## 2023-01-28 MED ORDER — LIDOCAINE VISCOUS HCL 2 % MT SOLN: 15.0000 mL | OROMUCOSAL | 0 refills | Status: DC | PRN

## 2023-01-28 NOTE — Progress Notes (Unsigned)
Assessment & Plan:  Sore throat -     POCT rapid strep A -     POC COVID-19 BinaxNow -     POCT Influenza A/B -     Lidocaine Viscous HCl; Use as directed 15 mLs in the mouth or throat every 3 (three) hours as needed for mouth pain (gargle; may spit or swallow).  Dispense: 100 mL; Refill: 0 -     predniSONE; Take 40 mg by mouth on day 1, then taper 10 mg daily until gone  Dispense: 10 tablet; Refill: 0 -     Culture, Group A Strep  Fever, unspecified fever cause -     POCT rapid strep A -     POC COVID-19 BinaxNow -     POCT Influenza A/B  Nauseated -     POC COVID-19 BinaxNow -     POCT Influenza A/B -     Ondansetron HCl; Take 1 tablet (4 mg total) by mouth every 8 (eight) hours as needed for nausea or vomiting.  Dispense: 20 tablet; Refill: 0  Pharyngitis, unspecified etiology Assessment & Plan: Patient nontoxic in appearance.  She is afebrile.  Question if nausea is related to postnasal drip, congestion. point-of-care strep is negative.  I obtained a strep culture.  We discussed trial of prednisone and the suspected viral pharyngitis at this time.  Provided Zofran, lidocaine swish and swallow.  Patient let me know how she is doing      Return precautions given.   Risks, benefits, and alternatives of the medications and treatment plan prescribed today were discussed, and patient expressed understanding.   Education regarding symptom management and diagnosis given to patient on AVS either electronically or printed.  No follow-ups on file.  [name], FNP  Subjective:    Patient ID: Summer Thomas, female    DOB: 1981/03/22, 42 y.o.   MRN: [id number]  CC: Summer Thomas is a 42 y.o. female who presents today for an acute visit.    HPI: Complains of bilateral sore throat which is most bothersome symptom.   bilateral frontal of HA and nausea over 7 days ago and then 3 days ago she developed sore throat.   Endorses PND.  She wouldn't describes HA as  migraine.  No cough, vomiting, dysuria, abdominal pain, vision loss.  HA is not worse HA of life.   When she eats, she feels nausea. No epigastric burning, belching.   H/o migraine.   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  Treated with zpak a year ago at [facility] Once zpak 'ran out' , sore throat returned.   She had covid 2 months ago.  She is compliant with flonase, zyrtec.   H/o allergy shots  She is on continuous birth control.      Allergies: Doxycycline and Penicillins Current Outpatient Medications on File Prior to Visit  Medication Sig Dispense Refill   ALAYCEN 1/35 tablet Take 1 tablet by mouth daily.     cetirizine (ZYRTEC) 10 MG tablet Take 1 tablet (10 mg total) by mouth daily. 30 tablet 11   fluticasone (FLONASE) 50 MCG/ACT nasal spray Place 2 sprays into both nostrils daily. 16 g 6   triamcinolone (NASACORT) 55 MCG/ACT AERO nasal inhaler Place 2 sprays into the nose daily.     No current facility-administered medications on file prior to visit.    Review of Systems  Constitutional:  Negative for chills  and fever.  HENT:  Positive for postnasal drip and sore throat. Negative for congestion.   Respiratory:  Negative for cough.   Cardiovascular:  Negative for chest pain and palpitations.  Gastrointestinal:  Positive for nausea. Negative for vomiting.  Neurological:  Positive for headaches.      Objective:    BP 118/80   Pulse 93   Temp 98.3 F (36.8 C)   Ht '5\' 7"'$  (1.702 m)   Wt 250 lb 6.4 oz (113.6 kg)   SpO2 93%   BMI 39.22 kg/m   BP Readings from Last 3 Encounters:  01/28/23 118/80  07/09/22 138/78  10/09/21 131/84   Wt Readings from Last 3 Encounters:  01/28/23 250 lb 6.4 oz (113.6 kg)  07/09/22 245 lb 3.2 oz (111.2 kg)  02/08/15 256 lb (116.1 kg)    Physical Exam Vitals reviewed.  Constitutional:      Appearance: She is well-developed.  HENT:     Head: Normocephalic and atraumatic.      Right Ear: Hearing, tympanic membrane, ear canal and external ear normal. No decreased hearing noted. No drainage, swelling or tenderness. No middle ear effusion. No foreign body. Tympanic membrane is not erythematous or bulging.     Left Ear: Hearing, tympanic membrane, ear canal and external ear normal. No decreased hearing noted. No drainage, swelling or tenderness.  No middle ear effusion. No foreign body. Tympanic membrane is not erythematous or bulging.     Nose: Nose normal. No rhinorrhea.     Right Sinus: No maxillary sinus tenderness or frontal sinus tenderness.     Left Sinus: No maxillary sinus tenderness or frontal sinus tenderness.     Mouth/Throat:     Pharynx: Uvula midline. Posterior oropharyngeal erythema present. No oropharyngeal exudate.     Tonsils: No tonsillar exudate or tonsillar abscesses.  Eyes:     Conjunctiva/sclera: Conjunctivae normal.  Cardiovascular:     Rate and Rhythm: Regular rhythm.     Pulses: Normal pulses.     Heart sounds: Normal heart sounds.  Pulmonary:     Effort: Pulmonary effort is normal.     Breath sounds: Normal breath sounds. No wheezing, rhonchi or rales.  Lymphadenopathy:     Head:     Right side of head: No submental, submandibular, tonsillar, preauricular, posterior auricular or occipital adenopathy.     Left side of head: No submental, submandibular, tonsillar, preauricular, posterior auricular or occipital adenopathy.     Cervical: No cervical adenopathy.  Skin:    General: Skin is warm and dry.  Neurological:     Mental Status: She is alert.  Psychiatric:        Speech: Speech normal.        Behavior: Behavior normal.        Thought Content: Thought content normal.

## 2023-01-28 NOTE — Patient Instructions (Signed)
You may start prednisone taper however make sure you get all doses then by 12:00pm as it can be quite stimulating/ interfere with sleep.  You may use viscous lidocaine.  Ibuprofen as needed.  Will await throat culture.  Certainly if anything were to change between now and results of throat culture, please let me know right away  Sore Throat A sore throat is pain, burning, irritation, or scratchiness in the throat. When you have a sore throat, you may feel pain or tenderness in your throat when you swallow or talk. Many things can cause a sore throat, including: An infection. Seasonal allergies. Dryness in the air. Irritants, such as smoke or pollution. Radiation treatment for cancer. Gastroesophageal reflux disease (GERD). A tumor. A sore throat is often the first sign of another sickness. It may happen with other symptoms, such as coughing, sneezing, fever, and swollen neck glands. Most sore throats go away without medical treatment. Follow these instructions at home:     Medicines Take over-the-counter and prescription medicines only as told by your health care provider. Children often get sore throats. Do not give your child aspirin because of the association with Reye's syndrome. Use throat sprays to soothe your throat as told by your health care provider. Managing pain To help with pain, try: Sipping warm liquids, such as broth, herbal tea, or warm water. Eating or drinking cold or frozen liquids, such as frozen ice pops. Gargling with a mixture of salt and water 3-4 times a day or as needed. To make salt water, completely dissolve -1 tsp (3-6 g) of salt in 1 cup (237 mL) of warm water. Sucking on hard candy or throat lozenges. Putting a cool-mist humidifier in your bedroom at night to moisten the air. Sitting in the bathroom with the door closed for 5-10 minutes while you run hot water in the shower. General instructions Do not use any products that contain nicotine or tobacco.  These products include cigarettes, chewing tobacco, and vaping devices, such as e-cigarettes. If you need help quitting, ask your health care provider. Rest as needed. Drink enough fluid to keep your urine pale yellow. Wash your hands often with soap and water for at least 20 seconds. If soap and water are not available, use hand sanitizer. Contact a health care provider if: You have a fever for more than 2-3 days. You have symptoms that last for more than 2-3 days. Your throat does not get better within 7 days. You have a fever and your symptoms suddenly get worse. Get help right away if: You have difficulty breathing. You cannot swallow fluids, soft foods, or your saliva. You have increased swelling in your throat or neck. You have persistent nausea and vomiting. These symptoms may represent a serious problem that is an emergency. Do not wait to see if the symptoms will go away. Get medical help right away. Call your local emergency services (911 in the [place] Do not drive yourself to the hospital. Summary A sore throat is pain, burning, irritation, or scratchiness in the throat. Many things can cause a sore throat. Take over-the-counter medicines only as told by your health care provider. Rest as needed. Drink enough fluid to keep your urine pale yellow. Contact a health care provider if your throat does not get better within 7 days. This information is not intended to replace advice given to you by your health care provider. Make sure you discuss any questions you have with your health care provider. Document Revised: 02/08/2021 Document  Reviewed: 02/08/2021 Elsevier Patient Education  [place].

## 2023-01-29 ENCOUNTER — Encounter: Payer: Self-pay | Admitting: Family

## 2023-01-29 ENCOUNTER — Other Ambulatory Visit: Payer: Self-pay | Admitting: Family

## 2023-01-29 DIAGNOSIS — J029 Acute pharyngitis, unspecified: Secondary | ICD-10-CM

## 2023-01-29 MED ORDER — AZITHROMYCIN 250 MG PO TABS: ORAL_TABLET | ORAL | 0 refills | Status: AC

## 2023-01-29 NOTE — Telephone Encounter (Signed)
Spoke with pt Discussed rash and potential for scarlet fever.  No fever, shortness of breath, throat swelling, trouble swallowing Rash on inside of arms has improved.  Rash is not itchy or painful  She hasn't started prednisone  She is concerned with bacterial infection.  She preferred to start Z-Pak at this time.  Currently awaiting strep culture.  Plan; Advised patient to hold on starting prednisone so we can start 1 agent at a time.  She will start Z-Pak.  She will continue Zyrtec and she may start over-the-counter Benadryl.  She will monitor rash.

## 2023-01-29 NOTE — Assessment & Plan Note (Addendum)
Patient nontoxic in appearance.  She is afebrile.  Question if nausea is related to postnasal drip, congestion. point-of-care strep is negative.  I obtained a strep culture.  We discussed trial of prednisone and the suspected viral pharyngitis at this time.  Provided Zofran, lidocaine swish and swallow.  Patient let me know how she is doing

## 2023-01-29 NOTE — Progress Notes (Signed)
close

## 2023-01-30 LAB — CULTURE, GROUP A STREP
MICRO NUMBER:: 14644956
SPECIMEN QUALITY:: ADEQUATE

## 2023-03-12 ENCOUNTER — Telehealth: Payer: 59 | Admitting: Physician Assistant

## 2023-03-12 DIAGNOSIS — J020 Streptococcal pharyngitis: Secondary | ICD-10-CM | POA: Diagnosis not present

## 2023-03-12 MED ORDER — AZITHROMYCIN 250 MG PO TABS: ORAL_TABLET | ORAL | 0 refills | Status: AC

## 2023-03-12 NOTE — Progress Notes (Signed)
Virtual Visit Consent   Marcianna Daily [facility], you are scheduled for a virtual visit with a [facility] provider today. Just as with appointments in the office, your consent must be obtained to participate. Your consent will be active for this visit and any virtual visit you may have with one of our providers in the next 365 days. If you have a MyChart account, a copy of this consent can be sent to you electronically.  As this is a virtual visit, video technology does not allow for your provider to perform a traditional examination. This may limit your provider's ability to fully assess your condition. If your provider identifies any concerns that need to be evaluated in person or the need to arrange testing (such as labs, EKG, etc.), we will make arrangements to do so. Although advances in technology are sophisticated, we cannot ensure that it will always work on either your end or our end. If the connection with a video visit is poor, the visit may have to be switched to a telephone visit. With either a video or telephone visit, we are not always able to ensure that we have a secure connection.  By engaging in this virtual visit, you consent to the provision of healthcare and authorize for your insurance to be billed (if applicable) for the services provided during this visit. Depending on your insurance coverage, you may receive a charge related to this service.  I need to obtain your verbal consent now. Are you willing to proceed with your visit today? Valory Wetherby has provided verbal consent on 03/12/2023 for a virtual visit (video or telephone). [name], PA-C  Date: 03/12/2023 10:36 AM  Virtual Visit via Video Note   I, [name], connected with  Gwynevere Lizana  ([id number], 1980-12-09) on 03/12/23 at 10:30 AM EDT by a video-enabled telemedicine application and verified that I am speaking with the correct person using two identifiers.  Location: Patient:  Virtual Visit Location Patient: Home Provider: Virtual Visit Location Provider: Home Office   I discussed the limitations of evaluation and management by telemedicine and the availability of in person appointments. The patient expressed understanding and agreed to proceed.    History of Present Illness: Summer Thomas is a 42 y.o. who identifies as a female who was assigned female at birth, and is being seen today for sore throat.  HPI: Sore Throat  This is a new problem. The current episode started yesterday. The problem has been gradually worsening. Maximum temperature: subjective fevers. The fever has been present for 1 to 2 days. Associated symptoms include headaches, swollen glands and trouble swallowing. Pertinent negatives include no congestion, coughing, diarrhea, ear discharge, ear pain, hoarse voice, plugged ear sensation or vomiting. Associated symptoms comments: Decreased appetite and nausea. She has had exposure to strep. Exposure to: son diagnosed yesterday. Treatments tried: tylenol, advil, mucinex lozenges. The treatment provided no relief.     Problems:  Patient Active Problem List   Diagnosis Date Noted   Pharyngitis 10/07/2022   Plantar fasciitis of right foot 07/15/2022   Obesity, Class II, BMI 35-39.9 07/15/2022   Encounter for hepatitis C screening test for low risk patient 07/15/2022   Anemia 07/15/2022    Allergies:  Allergies  Allergen Reactions   Doxycycline    Penicillins Rash    Childhood rxn   Medications:  Current Outpatient Medications:    ALAYCEN 1/35 tablet, Take 1 tablet by mouth daily., Disp: , Rfl:    azithromycin (  ZITHROMAX) 250 MG tablet, Take 2 tablets on day 1, then 1 tablet daily on days 2 through 5, Disp: 6 tablet, Rfl: 0   cetirizine (ZYRTEC) 10 MG tablet, Take 1 tablet (10 mg total) by mouth daily., Disp: 30 tablet, Rfl: 11   fluticasone (FLONASE) 50 MCG/ACT nasal spray, Place 2 sprays into both nostrils daily., Disp: 16 g, Rfl: 6    lidocaine (XYLOCAINE) 2 % solution, Use as directed 15 mLs in the mouth or throat every 3 (three) hours as needed for mouth pain (gargle; may spit or swallow)., Disp: 100 mL, Rfl: 0   ondansetron (ZOFRAN) 4 MG tablet, Take 1 tablet (4 mg total) by mouth every 8 (eight) hours as needed for nausea or vomiting., Disp: 20 tablet, Rfl: 0   predniSONE (DELTASONE) 10 MG tablet, Take 40 mg by mouth on day 1, then taper 10 mg daily until gone, Disp: 10 tablet, Rfl: 0   triamcinolone (NASACORT) 55 MCG/ACT AERO nasal inhaler, Place 2 sprays into the nose daily., Disp: , Rfl:   Observations/Objective: Patient is well-developed, well-nourished in no acute distress.  Resting comfortably at home.  Head is normocephalic, atraumatic.  No labored breathing.  Speech is clear and coherent with logical content.  Patient is alert and oriented at baseline.    Assessment and Plan: 1. Strep throat - azithromycin (ZITHROMAX) 250 MG tablet; Take 2 tablets on day 1, then 1 tablet daily on days 2 through 5  Dispense: 6 tablet; Refill: 0  - Suspect strep throat - Azithromycin prescribed - Tylenol and Ibuprofen alternating every 4 hours - Salt water gargles - Chloraseptic spray - Liquid and soft food diet - Push fluids - New toothbrush in 3 days - Seek in person evaluation if not improving or if symptoms worsen   Follow Up Instructions: I discussed the assessment and treatment plan with the patient. The patient was provided an opportunity to ask questions and all were answered. The patient agreed with the plan and demonstrated an understanding of the instructions.  A copy of instructions were sent to the patient via MyChart unless otherwise noted below.    The patient was advised to call back or seek an in-person evaluation if the symptoms worsen or if the condition fails to improve as anticipated.  Time:  I spent 8 minutes with the patient via telehealth technology discussing the above problems/concerns.     [name], PA-C

## 2023-03-12 NOTE — Patient Instructions (Signed)
[name], thank you for joining [name], PA-C for today's virtual visit.  While this provider is not your primary care provider (PCP), if your PCP is located in our provider database this encounter information will be shared with them immediately following your visit.   A [facility] MyChart account gives you access to today's visit and all your visits, tests, and labs performed at [facility] " click here if you don't have a [facility] MyChart account or go to [email or web address]  Consent: (Patient) Summer Thomas [facility] provided verbal consent for this virtual visit at the beginning of the encounter.  Current Medications:  Current Outpatient Medications:    ALAYCEN 1/35 tablet, Take 1 tablet by mouth daily., Disp: , Rfl:    azithromycin (ZITHROMAX) 250 MG tablet, Take 2 tablets on day 1, then 1 tablet daily on days 2 through 5, Disp: 6 tablet, Rfl: 0   cetirizine (ZYRTEC) 10 MG tablet, Take 1 tablet (10 mg total) by mouth daily., Disp: 30 tablet, Rfl: 11   fluticasone (FLONASE) 50 MCG/ACT nasal spray, Place 2 sprays into both nostrils daily., Disp: 16 g, Rfl: 6   lidocaine (XYLOCAINE) 2 % solution, Use as directed 15 mLs in the mouth or throat every 3 (three) hours as needed for mouth pain (gargle; may spit or swallow)., Disp: 100 mL, Rfl: 0   ondansetron (ZOFRAN) 4 MG tablet, Take 1 tablet (4 mg total) by mouth every 8 (eight) hours as needed for nausea or vomiting., Disp: 20 tablet, Rfl: 0   predniSONE (DELTASONE) 10 MG tablet, Take 40 mg by mouth on day 1, then taper 10 mg daily until gone, Disp: 10 tablet, Rfl: 0   triamcinolone (NASACORT) 55 MCG/ACT AERO nasal inhaler, Place 2 sprays into the nose daily., Disp: , Rfl:    Medications ordered in this encounter:  Meds ordered this encounter  Medications   azithromycin (ZITHROMAX) 250 MG tablet    Sig: Take 2 tablets on day 1, then 1 tablet daily on days 2 through 5    Dispense:  6 tablet     Refill:  0    Order Specific Question:   Supervising Provider    Answer:   Merrilee Jansky [[id number]]     *If you need refills on other medications prior to your next appointment, please contact your pharmacy*  Follow-Up: Call back or seek an in-person evaluation if the symptoms worsen or if the condition fails to improve as anticipated.  [facility] [phone]  Other Instructions  Strep Throat, Adult Strep throat is an infection in the throat that is caused by bacteria. It is common during the cold months of the year. It mostly affects children who are 81-1 years old. However, people of all ages can get it at any time of the year. This infection spreads from person to person (is contagious) through coughing, sneezing, or having close contact. Your health care provider may use other names to describe the infection. When strep throat affects the tonsils, it is called tonsillitis. When it affects the back of the throat, it is called pharyngitis. What are the causes? This condition is caused by the Streptococcus pyogenes bacteria. What increases the risk? You are more likely to develop this condition if: You care for school-age children, or are around school-age children. Children are more likely to get strep throat and may spread it to others. You spend time in crowded places where the infection can spread easily. You have  close contact with someone who has strep throat. What are the signs or symptoms? Symptoms of this condition include: Fever or chills. Redness, swelling, or pain in the tonsils or throat. Pain or difficulty when swallowing. White or yellow spots on the tonsils or throat. Tender glands in the neck and under the jaw. Bad smelling breath. Red rash all over the body. This is rare. How is this diagnosed? This condition is diagnosed by tests that check for the presence and the amount of bacteria that cause strep throat. They are: Rapid strep test. Your  throat is swabbed and checked for the presence of bacteria. Results are usually ready in minutes. Throat culture test. Your throat is swabbed. The sample is placed in a cup that allows infections to grow. Results are usually ready in 1 or 2 days. How is this treated? This condition may be treated with: Medicines that kill germs (antibiotics). Medicines that relieve pain or fever. These include: Ibuprofen or acetaminophen. Aspirin, only for people who are over the age of 54. Throat lozenges. Throat sprays. Follow these instructions at home: Medicines  Take over-the-counter and prescription medicines only as told by your health care provider. Take your antibiotic medicine as told by your health care provider. Do not stop taking the antibiotic even if you start to feel better. Eating and drinking  If you have trouble swallowing, try eating soft foods until your sore throat feels better. Drink enough fluid to keep your urine pale yellow. To help relieve pain, you may have: Warm fluids, such as soup and tea. Cold fluids, such as frozen desserts or popsicles. General instructions Gargle with a salt-water mixture 3-4 times a day or as needed. To make a salt-water mixture, completely dissolve -1 tsp (3-6 g) of salt in 1 cup (237 mL) of warm water. Get plenty of rest. Stay home from work or school until you have been taking antibiotics for 24 hours. Do not use any products that contain nicotine or tobacco. These products include cigarettes, chewing tobacco, and vaping devices, such as e-cigarettes. If you need help quitting, ask your health care provider. It is up to you to get your test results. Ask your health care provider, or the department that is doing the test, when your results will be ready. Keep all follow-up visits. This is important. How is this prevented?  Do not share food, drinking cups, or personal items that could cause the infection to spread to other people. Wash your hands  often with soap and water for at least 20 seconds. If soap and water are not available, use hand sanitizer. Make sure that all people in your house wash their hands well. Have family members tested if they have a sore throat or fever. They may need an antibiotic if they have strep throat. Contact a health care provider if: You have swelling in your neck that keeps getting bigger. You develop a rash, cough, or earache. You cough up a thick mucus that is green, yellow-brown, or bloody. You have pain or discomfort that does not get better with medicine. Your symptoms seem to be getting worse. You have a fever. Get help right away if: You have new symptoms, such as vomiting, severe headache, stiff or painful neck, chest pain, or shortness of breath. You have severe throat pain, drooling, or changes in your voice. You have swelling of the neck, or the skin on the neck becomes red and tender. You have signs of dehydration, such as tiredness (fatigue), dry mouth,  and decreased urination. You become increasingly sleepy, or you cannot wake up completely. Your joints become red or painful. These symptoms may represent a serious problem that is an emergency. Do not wait to see if the symptoms will go away. Get medical help right away. Call your local emergency services (911 in the [place] Do not drive yourself to the hospital. Summary Strep throat is an infection in the throat that is caused by the Streptococcus pyogenes bacteria. This infection is spread from person to person (is contagious) through coughing, sneezing, or having close contact. Take your medicines, including antibiotics, as told by your health care provider. Do not stop taking the antibiotic even if you start to feel better. To prevent the spread of germs, wash your hands well with soap and water. Have others do the same. Do not share food, drinking cups, or personal items. Get help right away if you have new symptoms, such as vomiting,  severe headache, stiff or painful neck, chest pain, or shortness of breath. This information is not intended to replace advice given to you by your health care provider. Make sure you discuss any questions you have with your health care provider. Document Revised: 03/07/2021 Document Reviewed: 03/07/2021 Elsevier Patient Education  2023 Elsevier Inc.    If you have been instructed to have an in-person evaluation today at a local Urgent Care facility, please use the link below. It will take you to a list of all of our available [facility], including address, phone number and hours of operation. Please do not delay care.  [facility]  If you or a family member do not have a primary care provider, use the link below to schedule a visit and establish care. When you choose a [facility] primary care physician or advanced practice provider, you gain a long-term partner in health. Find a Primary Care Provider  Learn more about [facility] in-office and virtual care options: [facility] - Get Care Now

## 2023-03-22 ENCOUNTER — Encounter: Payer: Self-pay | Admitting: Nurse Practitioner

## 2023-03-22 ENCOUNTER — Ambulatory Visit (INDEPENDENT_AMBULATORY_CARE_PROVIDER_SITE_OTHER): Payer: 59 | Admitting: Nurse Practitioner

## 2023-03-22 VITALS — BP 144/100 | HR 89 | Temp 98.8°F | Ht 67.0 in | Wt 257.2 lb

## 2023-03-22 DIAGNOSIS — J029 Acute pharyngitis, unspecified: Secondary | ICD-10-CM

## 2023-03-22 LAB — POCT RAPID STREP A (OFFICE): Rapid Strep A Screen: NEGATIVE

## 2023-03-22 MED ORDER — AZELASTINE HCL 0.1 % NA SOLN: 2.0000 | Freq: Two times a day (BID) | NASAL | 2 refills | Status: DC

## 2023-03-22 NOTE — Progress Notes (Signed)
Bethanie Dicker, NP-C Phone: [phone]  Summer Thomas is a 42 y.o. female who presents today for recurrent sore throat.   Patient was seen via virtual visit on 03/12/2023 for strep throat. Her son had tested positive for strep. She was treated with a Zpak at that time. She did complete the antibiotic. She reports her sore throat returned 3 days after completing the Zpak. Associated symptoms include pain while swallowing, sore throat, and swollen glands. She is drinking moderate amounts of fluids. She was also previously seen for similar symptoms in March. Strep test in office and culture were both negative at that time. Those symptoms did resolve after being treated with a Zpak then returned 2 weeks ago.   Social History   Tobacco Use  Smoking Status Never  Smokeless Tobacco Never    Current Outpatient Medications on File Prior to Visit  Medication Sig Dispense Refill   ALAYCEN 1/35 tablet Take 1 tablet by mouth daily.     cetirizine (ZYRTEC) 10 MG tablet Take 1 tablet (10 mg total) by mouth daily. 30 tablet 11   fluticasone (FLONASE) 50 MCG/ACT nasal spray Place 2 sprays into both nostrils daily. 16 g 6   No current facility-administered medications on file prior to visit.    ROS see history of present illness  Objective  Physical Exam Vitals:   03/22/23 0832  BP: (!) 144/100  Pulse: 89  Temp: 98.8 F (37.1 C)  SpO2: 99%    BP Readings from Last 3 Encounters:  03/22/23 (!) 144/100  01/28/23 118/80  07/09/22 138/78   Wt Readings from Last 3 Encounters:  03/22/23 257 lb 3.2 oz (116.7 kg)  01/28/23 250 lb 6.4 oz (113.6 kg)  07/09/22 245 lb 3.2 oz (111.2 kg)    Physical Exam Constitutional:      General: She is not in acute distress.    Appearance: Normal appearance.  HENT:     Head: Normocephalic.     Right Ear: Tympanic membrane normal.     Left Ear: Tympanic membrane normal.     Nose: Nose normal.     Mouth/Throat:     Mouth: Mucous membranes are  moist.     Pharynx: Oropharynx is clear. Posterior oropharyngeal erythema present.     Tonsils: No tonsillar exudate. 1+ on the right. 1+ on the left.  Eyes:     Conjunctiva/sclera: Conjunctivae normal.     Pupils: Pupils are equal, round, and reactive to light.  Cardiovascular:     Rate and Rhythm: Normal rate and regular rhythm.     Heart sounds: Normal heart sounds.  Pulmonary:     Effort: Pulmonary effort is normal.     Breath sounds: Normal breath sounds.  Abdominal:     General: Abdomen is flat. Bowel sounds are normal.     Palpations: Abdomen is soft.     Tenderness: There is no abdominal tenderness.  Lymphadenopathy:     Cervical: Cervical adenopathy present.  Skin:    General: Skin is warm and dry.  Neurological:     General: No focal deficit present.     Mental Status: She is alert.  Psychiatric:        Mood and Affect: Mood normal.        Behavior: Behavior normal.    Assessment/Plan: Please see individual problem list.  Pharyngitis, unspecified etiology Assessment & Plan: Recurrent issue since March. Patient denies any other symptoms today. Afebrile. Strep test in office was negative. Culture pending. Will add  Azelastine nasal spray. Advised patient to continue Zyrtec and Flonase. Discussed with patient possibility of viral cause since she recently completed antibiotics. Will refer to ENT. Return precautions given to patient.   Orders: -     POCT rapid strep A -     Culture, Group A Strep -     Ambulatory referral to ENT -     Azelastine HCl; Place 2 sprays into both nostrils 2 (two) times daily.  Dispense: 30 mL; Refill: 2   Return if symptoms worsen or fail to improve.   Bethanie Dicker, NP-C [facility] [employer]

## 2023-03-22 NOTE — Assessment & Plan Note (Signed)
Recurrent issue since March. Patient denies any other symptoms today. Afebrile. Strep test in office was negative. Culture pending. Will add Azelastine nasal spray. Advised patient to continue Zyrtec and Flonase. Discussed with patient possibility of viral cause since she recently completed antibiotics. Will refer to ENT. Return precautions given to patient.

## 2023-03-24 LAB — CULTURE, GROUP A STREP
MICRO NUMBER:: 14879770
SPECIMEN QUALITY:: ADEQUATE

## 2023-04-25 ENCOUNTER — Ambulatory Visit: Payer: 59 | Admitting: Podiatry

## 2023-06-26 ENCOUNTER — Encounter (INDEPENDENT_AMBULATORY_CARE_PROVIDER_SITE_OTHER): Payer: Self-pay

## 2023-07-09 ENCOUNTER — Ambulatory Visit: Payer: 59 | Admitting: Family Medicine

## 2023-07-09 ENCOUNTER — Encounter: Payer: Self-pay | Admitting: Family Medicine

## 2023-07-09 VITALS — BP 134/76 | HR 91 | Temp 97.9°F | Resp 16 | Ht 67.0 in | Wt 258.2 lb

## 2023-07-09 DIAGNOSIS — R140 Abdominal distension (gaseous): Secondary | ICD-10-CM | POA: Diagnosis not present

## 2023-07-09 DIAGNOSIS — R635 Abnormal weight gain: Secondary | ICD-10-CM | POA: Diagnosis not present

## 2023-07-09 LAB — COMPREHENSIVE METABOLIC PANEL
ALT: 13 U/L (ref 0–35)
AST: 15 U/L (ref 0–37)
Albumin: 4 g/dL (ref 3.5–5.2)
Alkaline Phosphatase: 50 U/L (ref 39–117)
BUN: 12 mg/dL (ref 6–23)
CO2: 26 mEq/L (ref 19–32)
Calcium: 9.4 mg/dL (ref 8.4–10.5)
Chloride: 102 mEq/L (ref 96–112)
Creatinine, Ser: 0.54 mg/dL (ref 0.40–1.20)
GFR: 112.45 mL/min (ref >60.00)
Glucose, Bld: 112 mg/dL — ABNORMAL HIGH (ref 70–99)
Potassium: 4.2 mEq/L (ref 3.5–5.1)
Sodium: 137 mEq/L (ref 135–145)
Total Bilirubin: 0.5 mg/dL (ref 0.2–1.2)
Total Protein: 7.2 g/dL (ref 6.0–8.3)

## 2023-07-09 LAB — TSH: TSH: 0.44 u[IU]/mL (ref 0.35–5.50)

## 2023-07-09 LAB — LIPID PANEL
Cholesterol: 161 mg/dL (ref 0–200)
HDL: 57.2 mg/dL (ref >39.00)
LDL Cholesterol: 66 mg/dL (ref 0–99)
NonHDL: 104.2
Total CHOL/HDL Ratio: 3
Triglycerides: 190 mg/dL — ABNORMAL HIGH (ref 0.0–149.0)
VLDL: 38 mg/dL (ref 0.0–40.0)

## 2023-07-09 LAB — CBC
HCT: 43.1 % (ref 36.0–46.0)
Hemoglobin: 14 g/dL (ref 12.0–15.0)
MCHC: 32.4 g/dL (ref 30.0–36.0)
MCV: 93.8 fl (ref 78.0–100.0)
Platelets: 373 10*3/uL (ref 150.0–400.0)
RBC: 4.6 Mil/uL (ref 3.87–5.11)
RDW: 13.8 % (ref 11.5–15.5)
WBC: 9.2 10*3/uL (ref 4.0–10.5)

## 2023-07-09 LAB — IBC + FERRITIN
Ferritin: 90.5 ng/mL (ref 10.0–291.0)
Iron: 132 ug/dL (ref 42–145)
Saturation Ratios: 32.5 % (ref 20.0–50.0)
TIBC: 406 ug/dL (ref 250.0–450.0)
Transferrin: 290 mg/dL (ref 212.0–360.0)

## 2023-07-09 LAB — HEMOGLOBIN A1C: Hgb A1c MFr Bld: 5.7 % (ref 4.6–6.5)

## 2023-07-09 LAB — VITAMIN B12: Vitamin B-12: 444 pg/mL (ref 211–911)

## 2023-07-09 LAB — VITAMIN D 25 HYDROXY (VIT D DEFICIENCY, FRACTURES): VITD: 49.61 ng/mL (ref 30.00–100.00)

## 2023-07-09 NOTE — Progress Notes (Unsigned)
SUBJECTIVE:   Chief Complaint  Patient presents with   Bloated   Weight Gain    X 6 months   HPI Presents to clinic for acute visit  Endorses weight gain and abdominal bloating for 6 months Was previously taking Metformin and had lost 40lbs.  Decided to stop medication.  Diet has not changed.  No increase in activity. Was wondering if OCP having any effect on bloating and is currently on continuous.  Denies any fevers, change in bowel habits, diarrhea, bloody stool, nausea, vomiting, abdominal pain, urinary symptoms.  Has had some previous history of constipation but reports daily bowel movements.  Has had some recent stressors in life at home but does not feel like this has been upsetting to her.  No previous history of GI issues or family history of GI disorders.   Denies ay symptome of reflux.  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    Appearance: She is normal weight. She is not ill-appearing.  HENT:     Head: Normocephalic.  Eyes:     Conjunctiva/sclera: Conjunctivae normal.  Cardiovascular:     Rate and Rhythm: Normal rate and regular rhythm.     Pulses: Normal pulses.  Pulmonary:     Effort: Pulmonary effort is normal.     Breath sounds: Normal breath sounds.  Abdominal:     General: Bowel sounds are increased. There is no distension.     Palpations: Abdomen is soft. There is no mass.     Tenderness: There is no abdominal tenderness. There is no right CVA tenderness, left CVA tenderness or guarding. Negative signs include Murphy's sign, McBurney's sign and obturator sign.     Hernia: No hernia is present.  Neurological:     Mental Status: She is alert. Mental status is at baseline.  Psychiatric:        Mood and Affect:  Mood normal.        Behavior: Behavior normal.        Thought Content: Thought content normal.        Judgment: Judgment normal.        07/09/2023    8:40 AM 01/28/2023   12:13 PM 09/24/2022    3:21 PM 07/09/2022   11:47 AM  Depression screen PHQ 2/9  Decreased Interest 0 0 0 0  Down, Depressed, Hopeless 0 0 0 0  PHQ - 2 Score 0 0 0 0  Altered sleeping 0 0    Tired, decreased energy 0 0    Change in appetite 0 0    Feeling bad or failure about yourself  0 0    Trouble concentrating 0 0    Moving slowly or fidgety/restless 0 0    Suicidal thoughts 0 0    PHQ-9 Score 0 0    Difficult doing work/chores Not difficult at all Not difficult at all        07/09/2023    8:40 AM 01/28/2023   12:13 PM  GAD 7 : Generalized Anxiety Score  Nervous, Anxious, on Edge 0 0  Control/stop worrying 0 0  Worry too much -  different things 0 0  Trouble relaxing 0 0  Restless 0 0  Easily annoyed or irritable 0 0  Afraid - awful might happen 0 0  Total GAD 7 Score 0 0  Anxiety Difficulty Not difficult at all Not difficult at all    ASSESSMENT/PLAN:  Abdominal bloating Assessment & Plan: Benign abdominal exam today. No red flags History of Uterine Leiomyoma may be contributing to abdominal fullness. Following with OB, do not have results of u/s to determine size  Currently on estrogen therapy which could be contributing to abdominal bloating Recommend she discuss switching to other form of birth control for AUB Will also trial Miralax to help regulate bowel movements Increase soluble fiber daily Increase water intake Increase activity Lower carbohydrate intake Will check labs today If no improvement in 4 weeks follow up with PCP  Orders: -     Hemoglobin A1c -     TSH -     Comprehensive metabolic panel -     CBC -     IBC + Ferritin  Weight gain Assessment & Plan: Suspect insulin resistance Will check labs today TyG index 4.98, highly suggestive of insulin resistance Recommend  low carbohydrate diet, higher protein diet and increase soluble fiber to help  Increase activity Mediterranean diet  Plan to recommend weight loss management with [facility] [place] pending labs  Orders: -     Hemoglobin A1c -     Lipid panel -     TSH -     Vitamin B12 -     VITAMIN D 25 Hydroxy (Vit-D Deficiency, Fractures) -     Comprehensive metabolic panel -     CBC -     POCT urine pregnancy   PDMP reviewed  Return in about 4 weeks (around 08/06/2023).  Dana Allan, MD

## 2023-07-09 NOTE — Patient Instructions (Addendum)
It was a pleasure meeting you today. Thank you for allowing me to take part in your health care.  Our goals for today as we discussed include:  Start Miralax 1 scoop daily Increase soluble fiber Increase water intake 64 oz daily Increase activity  We will get some labs today.  If they are abnormal or we need to do something about them, I will call you.  If they are normal, I will send you a message on MyChart (if it is active) or a letter in the mail.  If you don't hear from [place] in 2 weeks, please call the office at the number below.   Follow up in 4 weeks  Recommend Tetanus Vaccination.  This is given every 10 years.    If you have any questions or concerns, please do not hesitate to call the office at [phone].  I look forward to our next visit and until then take care and stay safe.  Regards,   Dana Allan, MD   [facility]

## 2023-07-11 ENCOUNTER — Encounter (INDEPENDENT_AMBULATORY_CARE_PROVIDER_SITE_OTHER): Payer: Self-pay

## 2023-07-22 ENCOUNTER — Encounter: Payer: Self-pay | Admitting: Family Medicine

## 2023-07-22 DIAGNOSIS — R635 Abnormal weight gain: Secondary | ICD-10-CM | POA: Insufficient documentation

## 2023-07-22 DIAGNOSIS — R140 Abdominal distension (gaseous): Secondary | ICD-10-CM | POA: Insufficient documentation

## 2023-07-22 NOTE — Assessment & Plan Note (Signed)
Suspect insulin resistance Will check labs today TyG index 4.98, highly suggestive of insulin resistance Recommend low carbohydrate diet, higher protein diet and increase soluble fiber to help  Increase activity Mediterranean diet  Plan to recommend weight loss management with [facility] in [place] pending labs

## 2023-07-22 NOTE — Assessment & Plan Note (Signed)
Benign abdominal exam today. No red flags History of Uterine Leiomyoma may be contributing to abdominal fullness. Following with OB, do not have results of u/s to determine size  Currently on estrogen therapy which could be contributing to abdominal bloating Recommend Summer Thomas discuss switching to other form of birth control for AUB Will also trial Miralax to help regulate bowel movements Increase soluble fiber daily Increase water intake Increase activity Lower carbohydrate intake Will check labs today If no improvement in 4 weeks follow up with PCP

## 2023-07-29 ENCOUNTER — Encounter: Payer: Self-pay | Admitting: Family Medicine

## 2023-08-12 ENCOUNTER — Ambulatory Visit: Payer: 59 | Admitting: Family Medicine

## 2023-08-30 ENCOUNTER — Ambulatory Visit
Admission: RE | Admit: 2023-08-30 | Discharge: 2023-08-30 | Disposition: A | Discharge status: Home / Self Care | Payer: 59 | Source: Ambulatory Visit

## 2023-08-30 VITALS — BP 137/84 | HR 87 | Temp 98.8°F | Resp 18

## 2023-08-30 DIAGNOSIS — Z23 Encounter for immunization: Secondary | ICD-10-CM | POA: Diagnosis not present

## 2023-08-30 DIAGNOSIS — L02212 Cutaneous abscess of back [any part, except buttock]: Secondary | ICD-10-CM

## 2023-08-30 MED ORDER — TETANUS-DIPHTH-ACELL PERTUSSIS 5-2.5-18.5 LF-MCG/0.5 IM SUSY
0.5000 mL | PREFILLED_SYRINGE | Freq: Once | INTRAMUSCULAR | Status: AC
  Administered 2023-08-30: 0.5 mL via INTRAMUSCULAR

## 2023-08-30 MED ORDER — SULFAMETHOXAZOLE-TRIMETHOPRIM 800-160 MG PO TABS
1.0000 | ORAL_TABLET | Freq: Two times a day (BID) | ORAL | 0 refills | Status: AC
Start: 2023-08-30 — End: 2023-09-06

## 2023-08-30 NOTE — ED Triage Notes (Signed)
Patient to Urgent Care with complaints of an abscess present to her back.  Symptoms started approx one month ago. No drainage. Has been using hot compresses.

## 2023-08-30 NOTE — Discharge Instructions (Addendum)
Your tetanus was updated today.    Take the Bactrim as directed.    Keep your wound clean and dry.  Wash it gently twice a day with soap and water.  Apply an antibiotic cream twice a day.    Follow up with your PCP or a dermatologist.

## 2023-08-30 NOTE — ED Provider Notes (Signed)
Renaldo Fiddler    CSN: [id number] Arrival date & time: 08/30/23  8295      History   Chief Complaint Chief Complaint  Patient presents with   Abscess    Infected bump on my back. - Entered by patient    HPI Summer Thomas is a 42 y.o. female.  Patient presents with an abscess on her back x >1 month.  It has become more painful and swollen in the last 2 days.  Treating with warm compresses.  No drainage.  No fever, chills, or other symptoms.  Last tetanus unknown.  The history is provided by the patient and medical records.    Past Medical History:  Diagnosis Date   Hypertension 2011-2013   no meds currently, no BP problems since 2013   Missed ab    x 2 - no surgery required   Postoperative state 02/10/2015    Patient Active Problem List   Diagnosis Date Noted   Abdominal bloating 07/22/2023   Weight gain 07/22/2023   Pharyngitis 10/07/2022   Plantar fasciitis of right foot 07/15/2022   Obesity, Class II, BMI 35-39.9 07/15/2022   Encounter for hepatitis C screening test for low risk patient 07/15/2022   Anemia 07/15/2022    Past Surgical History:  Procedure Laterality Date   CERVICAL CERCLAGE  2011   CERVICAL CERCLAGE N/A 08/16/2014   Procedure: Trans Vaginal Cerclage, Vaginal Ultrasound, Modified Macdonald, Bladder Instillation Milk;  Surgeon: Loney Laurence, MD;  Location: [facility];  Service: Gynecology;  Laterality: N/A;   CERVICAL CERCLAGE N/A 02/10/2015   Procedure: CERCLAGE CERVICAL;  Surgeon: Carrington Clamp, MD;  Location: [facility];  Service: Obstetrics;  Laterality: N/A;   CESAREAN SECTION  08/2010   37 wks    CESAREAN SECTION N/A 02/10/2015   Procedure: CESAREAN SECTION , ;  Surgeon: Carrington Clamp, MD;  Location: [facility];  Service: Obstetrics;  Laterality: N/A;   WISDOM TOOTH EXTRACTION      OB History     Gravida  4   Para  2   Term  2   Preterm      AB  2   Living  1      SAB  2   IAB      Ectopic      Multiple  0    Live Births  1            Home Medications    Prior to Admission medications   Medication Sig Start Date End Date Taking? Authorizing Provider  metFORMIN (GLUCOPHAGE-XR) 500 MG 24 hr tablet Take 500 mg by mouth daily. 08/05/23  Yes [provider]  sulfamethoxazole-trimethoprim (BACTRIM DS) 800-160 MG tablet Take 1 tablet by mouth 2 (two) times daily for 7 days. 08/30/23 09/06/23 Yes Mickie Bail, NP  ALAYCEN 1/35 tablet Take 1 tablet by mouth daily. 07/21/21   [provider]  cetirizine (ZYRTEC) 10 MG tablet Take 1 tablet (10 mg total) by mouth daily. 09/24/22   Dana Allan, MD  fluticasone (FLONASE) 50 MCG/ACT nasal spray Place 2 sprays into both nostrils daily. 12/01/22   Bennie Pierini, FNP    Family History Family History  Problem Relation Age of Onset   Hypertension Mother    Hearing loss Mother    Depression Mother    Arthritis Mother    Hypertension Father    Diabetes Father    Depression Father    Cancer Father    Arthritis Father  Hearing loss Sister    Depression Sister    Depression Brother     Social History Social History   Tobacco Use   Smoking status: Never   Smokeless tobacco: Never  Vaping Use   Vaping status: Never Used  Substance Use Topics   Alcohol use: Yes   Drug use: No     Allergies   Doxycycline and Penicillins   Review of Systems Review of Systems  Constitutional:  Negative for chills and fever.  Musculoskeletal:  Negative for back pain and gait problem.  Skin:  Positive for color change and wound.  Neurological:  Negative for weakness and numbness.     Physical Exam Triage Vital Signs ED Triage Vitals  Encounter Vitals Group     BP      Systolic BP Percentile      Diastolic BP Percentile      Pulse      Resp      Temp      Temp src      SpO2      Weight      Height      Head Circumference      Peak Flow      Pain Score      Pain Loc      Pain Education      Exclude from Growth  Chart    No data found.  Updated Vital Signs BP 137/84   Pulse 87   Temp 98.8 F (37.1 C)   Resp 18   SpO2 97%   Visual Acuity Right Eye Distance:   Left Eye Distance:   Bilateral Distance:    Right Eye Near:   Left Eye Near:    Bilateral Near:     Physical Exam Constitutional:      General: She is not in acute distress. HENT:     Mouth/Throat:     Mouth: Mucous membranes are moist.  Cardiovascular:     Rate and Rhythm: Normal rate and regular rhythm.  Pulmonary:     Effort: Pulmonary effort is normal. No respiratory distress.  Skin:    General: Skin is warm and dry.     Findings: Erythema and lesion present.     Comments: Tender 1 cm raised pus-filled lesion on right mid back with 3 cm area of induration and 5 cm circular area of surrounding erythema.  No drainage.  Neurological:     General: No focal deficit present.     Mental Status: She is alert and oriented to person, place, and time.     Sensory: No sensory deficit.     Motor: No weakness.  Psychiatric:        Mood and Affect: Mood normal.        Behavior: Behavior normal.      UC Treatments / Results  Labs (all labs ordered are listed, but only abnormal results are displayed) Labs Reviewed - No data to display  EKG   Radiology No results found.  Procedures Incision and Drainage  Date/Time: 08/30/2023 8:53 AM  Performed by: Mickie Bail, NP Authorized by: Mickie Bail, NP   Consent:    Consent obtained:  Verbal   Consent given by:  Patient   Risks discussed:  Bleeding, incomplete drainage, infection and pain Universal protocol:    Procedure explained and questions answered to patient or proxy's satisfaction: yes   Location:    Type:  Abscess   Location:  Trunk   Trunk  location:  Back Pre-procedure details:    Skin preparation:  Povidone-iodine Anesthesia:    Anesthesia method:  Local infiltration   Local anesthetic:  Lidocaine 1% WITH epi Procedure type:    Complexity:   Simple Procedure details:    Incision types:  Single straight   Drainage:  Purulent and bloody   Drainage amount:  Moderate   Wound treatment:  Wound left open   Packing materials:  None Post-procedure details:    Procedure completion:  Tolerated well, no immediate complications  (including critical care time)  Medications Ordered in UC Medications  Tdap (BOOSTRIX) injection 0.5 mL (0.5 mLs Intramuscular Given 08/30/23 0852)    Initial Impression / Assessment and Plan / UC Course  I have reviewed the triage vital signs and the nursing notes.  Pertinent labs & imaging results that were available during my care of the patient were reviewed by me and considered in my medical decision making (see chart for details).    Abscess of right mid back.  I&D performed.  Tetanus updated.  Treating with Bactrim.  Wound care instructions discussed.  Education provided on skin abscess.  Instructed patient to follow-up with her PCP or dermatologist.  She agrees to plan of care.  Final Clinical Impressions(s) / UC Diagnoses   Final diagnoses:  Abscess of back     Discharge Instructions      Your tetanus was updated today.    Take the Bactrim as directed.    Keep your wound clean and dry.  Wash it gently twice a day with soap and water.  Apply an antibiotic cream twice a day.    Follow up with your PCP or a dermatologist.         ED Prescriptions     Medication Sig Dispense Auth. Provider   sulfamethoxazole-trimethoprim (BACTRIM DS) 800-160 MG tablet Take 1 tablet by mouth 2 (two) times daily for 7 days. 14 tablet Mickie Bail, NP      PDMP not reviewed this encounter.   Mickie Bail, NP 08/30/23 0900

## 2023-09-25 ENCOUNTER — Other Ambulatory Visit: Payer: Self-pay | Admitting: Nurse Practitioner

## 2023-10-26 ENCOUNTER — Ambulatory Visit: Payer: 59

## 2023-10-26 ENCOUNTER — Telehealth: Payer: 59 | Admitting: Family Medicine

## 2023-10-26 DIAGNOSIS — J4540 Moderate persistent asthma, uncomplicated: Secondary | ICD-10-CM

## 2023-10-26 MED ORDER — PREDNISONE 20 MG PO TABS: 20.0000 mg | ORAL_TABLET | Freq: Two times a day (BID) | ORAL | 0 refills | Status: AC

## 2023-10-26 MED ORDER — ALBUTEROL SULFATE HFA 108 (90 BASE) MCG/ACT IN AERS
2.0000 | INHALATION_SPRAY | Freq: Four times a day (QID) | RESPIRATORY_TRACT | 0 refills | Status: DC | PRN
Start: 2023-10-26 — End: 2023-11-18

## 2023-10-26 NOTE — Progress Notes (Signed)
Virtual Visit Consent   Summer Thomas [facility], you are scheduled for a virtual visit with a [facility] provider today. Just as with appointments in the office, your consent must be obtained to participate. Your consent will be active for this visit and any virtual visit you may have with one of our providers in the next 365 days. If you have a MyChart account, a copy of this consent can be sent to you electronically.  As this is a virtual visit, video technology does not allow for your provider to perform a traditional examination. This may limit your provider's ability to fully assess your condition. If your provider identifies any concerns that need to be evaluated in person or the need to arrange testing (such as labs, EKG, etc.), we will make arrangements to do so. Although advances in technology are sophisticated, we cannot ensure that it will always work on either your end or our end. If the connection with a video visit is poor, the visit may have to be switched to a telephone visit. With either a video or telephone visit, we are not always able to ensure that we have a secure connection.  By engaging in this virtual visit, you consent to the provision of healthcare and authorize for your insurance to be billed (if applicable) for the services provided during this visit. Depending on your insurance coverage, you may receive a charge related to this service.  I need to obtain your verbal consent now. Are you willing to proceed with your visit today? Summer Thomas has provided verbal consent on 10/26/2023 for a virtual visit (video or telephone). Georgana Curio, FNP  Date: 10/26/2023 8:33 AM  Virtual Visit via Video Note   I, Georgana Curio, connected with  Summer Thomas  ([id number], 1981-08-27) on 10/26/23 at  8:30 AM EST by a video-enabled telemedicine application and verified that I am speaking with the correct person using two identifiers.  Location: Patient: Virtual Visit  Location Patient: Home Provider: Virtual Visit Location Provider: Home Office   I discussed the limitations of evaluation and management by telemedicine and the availability of in person appointments. The patient expressed understanding and agreed to proceed.    History of Present Illness: Summer Thomas is a 42 y.o. who identifies as a female who was assigned female at birth, and is being seen today for covid 3 weeks ago with persistent cough and wheezing. She used her sons abuterol and it helped. No fever or colored mucus. Marland Kitchen  HPI: HPI  Problems:  Patient Active Problem List   Diagnosis Date Noted   Abdominal bloating 07/22/2023   Weight gain 07/22/2023   Pharyngitis 10/07/2022   Plantar fasciitis of right foot 07/15/2022   Obesity, Class II, BMI 35-39.9 07/15/2022   Encounter for hepatitis C screening test for low risk patient 07/15/2022   Anemia 07/15/2022    Allergies:  Allergies  Allergen Reactions   Doxycycline    Penicillins Rash    Childhood rxn   Medications:  Current Outpatient Medications:    albuterol (VENTOLIN HFA) 108 (90 Base) MCG/ACT inhaler, Inhale 2 puffs into the lungs every 6 (six) hours as needed for wheezing or shortness of breath., Disp: 8 g, Rfl: 0   predniSONE (DELTASONE) 20 MG tablet, Take 1 tablet (20 mg total) by mouth 2 (two) times daily with a meal for 5 days., Disp: 10 tablet, Rfl: 0   ALAYCEN 1/35 tablet, Take 1 tablet by mouth daily., Disp: , Rfl:  cetirizine (ZYRTEC) 10 MG tablet, Take 1 tablet (10 mg total) by mouth daily., Disp: 30 tablet, Rfl: 11   fluticasone (FLONASE) 50 MCG/ACT nasal spray, Place 2 sprays into both nostrils daily., Disp: 16 g, Rfl: 6   metFORMIN (GLUCOPHAGE-XR) 500 MG 24 hr tablet, Take 500 mg by mouth daily., Disp: , Rfl:   Observations/Objective: Patient is well-developed, well-nourished in no acute distress.  Resting comfortably  at home.  Head is normocephalic, atraumatic.  No labored breathing.  Speech is  clear and coherent with logical content.  Patient is alert and oriented at baseline.    Assessment and Plan: 1. 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   Follow Up Instructions: I discussed the assessment and treatment plan with the patient. The patient was provided an opportunity to ask questions and all were answered. The patient agreed with the plan and demonstrated an understanding of the instructions.  A copy of instructions were sent to the patient via MyChart unless otherwise noted below.     The patient was advised to call back or seek an in-person evaluation if the symptoms worsen or if the condition fails to improve as anticipated.    Georgana Curio, FNP

## 2023-10-26 NOTE — Patient Instructions (Signed)
Chronic Bronchitis, Adult  Chronic bronchitis is inflammation inside of the main airways (bronchi) that come off the windpipe (trachea) in the lungs. The swelling causes the airways to narrow and make more mucus than normal. This can make it hard to breathe and may cause coughing or noisy breathing (wheezing). This condition is a type of chronic obstructive pulmonary disease (COPD). Chronic bronchitis is often associated with other chronic respiratory conditions, such as emphysema, asthma, bronchiectasis, or cystic fibrosis. Chronic bronchitis is a long-term (chronic) condition. It is defined as a chronic cough with mucus (sputum) production: For at least 3 months of the year. For 2 years in a row. People with chronic bronchitis are more likely to get colds and other infections in the nose, throat, or airways. What are the causes? This condition is most often caused by: A history of smoking. Exposure to secondhand smoke or a smoky area for a long period of time. Frequent lung infections. Long-term exposure to certain fumes or chemicals that irritate the lungs. What are the signs or symptoms? Symptoms of chronic bronchitis may include: A cough that brings up mucus (productive cough). A whistling sound when you breathe (wheezing). Shortness of breath. Chest tightness or soreness. Fever or chills. Colds or respiratory infections that go away and return. How is this diagnosed? Your health care provider may diagnose this condition based on your signs and symptoms, especially if you have a cough that lasts a long time or keeps coming back. This condition may be diagnosed based on: Your symptoms and medical history. A physical exam, including listening to your lungs. Tests, such as: Testing a sputum sample. Blood tests. A chest X-ray. Tests of lung (pulmonary) function. How is this treated? There is no cure for chronic bronchitis. Treatment may help control your symptoms. This  includes: Drinking fluids. This may help thin your mucus so it is easier to cough up. Mucus-clearing techniques. Your health care provider will show you which techniques are best for you. Medicines such as: Inhaled medicine (inhaler) to improve air flow in and out of your lungs. Antibiotics to treat or prevent bacterial lung infections. Mucus-thinning medicines. Pulmonary rehabilitation. This is a program that helps you learn how to manage your breathing problem. The program may include exercise, education, counseling, treatment, and support. Using oxygen therapy, if your blood oxygen level is very low. Follow these instructions at home: Medicines Take over-the-counter and prescription medicines only as told by your health care provider. If you were prescribed an antibiotic medicine, take it as told by your health care provider. Do not stop taking the antibiotic even if you start to feel better. Lifestyle  Do not use any products that contain nicotine or tobacco. These products include cigarettes, chewing tobacco, and vaping devices, such as e-cigarettes. If you need help quitting, ask your health care provider. Stay away from other people's smoke (secondhand smoke) and any irritants that make you cough more, such as chemical fumes. Eat a healthy diet and get regular exercise. Talk with your health care provider about what activities are safe for you. Return to normal activities as told by your health care provider. Ask your health care provider what activities are safe for you. Preventing infections Stay up to date on all immunizations, including the pneumonia and flu vaccines. Wash your hands often with soap and water for at least 20 seconds. If soap and water are not available, use hand sanitizer. Avoid contact with people who have symptoms of a cold or the flu. Keep  your environment free from any known allergens such as dust, mold, pets, and pollen. General instructions Get plenty of  rest. Drink enough fluids to keep your urine pale yellow. Use oxygen therapy at home as directed. Follow instructions from your health care provider about how to use oxygen safely and take steps to prevent fire. Do not smoke while using oxygen or allow others to smoke in your home. Keep all follow-up visits. This is important. Contact a health care provider if: Your shortness of breath or coughing gets worse even when you take medicine. Your mucus gets thicker or changes color. You are not able to cough up your mucus. You have a fever. Get help right away if: You cough up blood. You have trouble breathing. You have chest pain. You feel dizzy or confused. These symptoms may represent a serious problem that is an emergency. Do not wait to see if the symptoms will go away. Get medical help right away. Call your local emergency services (911 in the [place] Do not drive yourself to the hospital. Summary Chronic bronchitis is inflammation inside of the main airways (bronchi) that come off the windpipe (trachea) in the lungs. The swelling causes the airways to narrow and make more mucus than normal. Chronic bronchitis is a long-term (chronic) condition. It is defined as a chronic cough with mucus (sputum) production for at least 3 months of the year for 2 years in a row. If you were prescribed an antibiotic medicine, take it as told by your health care provider. Do not stop taking the antibiotic even if you start to feel better. Do not use any products that contain nicotine or tobacco. These products include cigarettes, chewing tobacco, and vaping devices, such as e-cigarettes. If you need help quitting, ask your health care provider. This information is not intended to replace advice given to you by your health care provider. Make sure you discuss any questions you have with your health care provider. Document Revised: 03/15/2021 Document Reviewed: 03/15/2021 Elsevier Patient Education  2024  [employer].

## 2023-11-18 ENCOUNTER — Telehealth: Payer: 59

## 2023-11-18 DIAGNOSIS — R058 Other specified cough: Secondary | ICD-10-CM | POA: Diagnosis not present

## 2023-11-18 DIAGNOSIS — J4540 Moderate persistent asthma, uncomplicated: Secondary | ICD-10-CM | POA: Diagnosis not present

## 2023-11-18 MED ORDER — QVAR REDIHALER 40 MCG/ACT IN AERB: 1.0000 | INHALATION_SPRAY | Freq: Two times a day (BID) | RESPIRATORY_TRACT | 0 refills | Status: DC

## 2023-11-18 MED ORDER — PREDNISONE 10 MG (21) PO TBPK
ORAL_TABLET | ORAL | 0 refills | Status: DC
Start: 2023-11-18 — End: 2024-01-19

## 2023-11-18 MED ORDER — LEVALBUTEROL TARTRATE 45 MCG/ACT IN AERO
1.0000 | INHALATION_SPRAY | RESPIRATORY_TRACT | 12 refills | Status: DC | PRN
Start: 2023-11-18 — End: 2024-01-19

## 2023-11-18 NOTE — Progress Notes (Signed)
Virtual Visit Consent   [facility], you are scheduled for a virtual visit with a [place] provider today. Just as with appointments in the office, your consent must be obtained to participate. Your consent will be active for this visit and any virtual visit you may have with one of our providers in the next 365 days. If you have a MyChart account, a copy of this consent can be sent to you electronically.  As this is a virtual visit, video technology does not allow for your provider to perform a traditional examination. This may limit your provider's ability to fully assess your condition. If your provider identifies any concerns that need to be evaluated in person or the need to arrange testing (such as labs, EKG, etc.), we will make arrangements to do so. Although advances in technology are sophisticated, we cannot ensure that it will always work on either your end or our end. If the connection with a video visit is poor, the visit may have to be switched to a telephone visit. With either a video or telephone visit, we are not always able to ensure that we have a secure connection.  By engaging in this virtual visit, you consent to the provision of healthcare and authorize for your insurance to be billed (if applicable) for the services provided during this visit. Depending on your insurance coverage, you may receive a charge related to this service.  I need to obtain your verbal consent now. Are you willing to proceed with your visit today? Summer Thomas has provided verbal consent on 11/18/2023 for a virtual visit (video or telephone). Piedad Climes, [place]  Date: 11/18/2023 12:38 PM  Virtual Visit via Video Note   I, Piedad Climes, connected with  Summer Thomas  ([id number], 44/28/1982) on 11/18/23 at 12:30 PM EST by a video-enabled telemedicine application and verified that I am speaking with the correct person using two identifiers.  Location: Patient:  Virtual Visit Location Patient: Home Provider: Virtual Visit Location Provider: Home Office   I discussed the limitations of evaluation and management by telemedicine and the availability of in person appointments. The patient expressed understanding and agreed to proceed.    History of Present Illness: Summer Thomas is a 42 y.o. who identifies as a female who was assigned female at birth, and is being seen today for some continued/lingering cough and chest tightness. Patient diagnosed about 4.5 weeks ago with COVID that turned into a subsequent pneumonia. Was treated with a course of Azithromycin but due to persistent cough had a burst of prednisone added. Notes that still with episodic cough, worse in afternoons or with weather change. Is non productive but can be persistent and associated with chest tightness. Denies fever, chills, malaise. Is trying to use albuterol inhaler that was given before which she notes helps but causes a headache.    HPI: HPI  Problems:  Patient Active Problem List   Diagnosis Date Noted   Abdominal bloating 07/22/2023   Weight gain 07/22/2023   Pharyngitis 10/07/2022   Plantar fasciitis of right foot 07/15/2022   Obesity, Class II, BMI 35-39.9 07/15/2022   Encounter for hepatitis C screening test for low risk patient 07/15/2022   Anemia 07/15/2022    Allergies:  Allergies  Allergen Reactions   Doxycycline    Penicillins Rash    Childhood rxn   Medications:  Current Outpatient Medications:    beclomethasone (QVAR REDIHALER) 40 MCG/ACT inhaler, Inhale 1 puff into the lungs  2 (two) times daily., Disp: 1 each, Rfl: 0   levalbuterol (XOPENEX HFA) 45 MCG/ACT inhaler, Inhale 1-2 puffs into the lungs every 4 (four) hours as needed for wheezing., Disp: 1 each, Rfl: 12   predniSONE (STERAPRED UNI-PAK 21 TAB) 10 MG (21) TBPK tablet, Take following package directions, Disp: 21 tablet, Rfl: 0   ALAYCEN 1/35 tablet, Take 1 tablet by mouth daily., Disp: , Rfl:     fluticasone (FLONASE) 50 MCG/ACT nasal spray, Place 2 sprays into both nostrils daily., Disp: 16 g, Rfl: 6   metFORMIN (GLUCOPHAGE-XR) 500 MG 24 hr tablet, Take 500 mg by mouth daily., Disp: , Rfl:   Observations/Objective: Patient is well-developed, well-nourished in no acute distress.  Resting comfortably at home.  Head is normocephalic, atraumatic.  No labored breathing. Speech is clear and coherent with logical content.  Patient is alert and oriented at baseline.   Assessment and Plan: 1. Upper airway cough syndrome (Primary) - levalbuterol (XOPENEX HFA) 45 MCG/ACT inhaler; Inhale 1-2 puffs into the lungs every 4 (four) hours as needed for wheezing.  Dispense: 1 each; Refill: 12 - beclomethasone (QVAR REDIHALER) 40 MCG/ACT inhaler; Inhale 1 puff into the lungs 2 (two) times daily.  Dispense: 1 each; Refill: 0 - predniSONE (STERAPRED UNI-PAK 21 TAB) 10 MG (21) TBPK tablet; Take following package directions  Dispense: 21 tablet; Refill: 0  2. Moderate persistent asthmatic bronchitis without complication - levalbuterol (XOPENEX HFA) 45 MCG/ACT inhaler; Inhale 1-2 puffs into the lungs every 4 (four) hours as needed for wheezing.  Dispense: 1 each; Refill: 12 - beclomethasone (QVAR REDIHALER) 40 MCG/ACT inhaler; Inhale 1 puff into the lungs 2 (two) times daily.  Dispense: 1 each; Refill: 0 - predniSONE (STERAPRED UNI-PAK 21 TAB) 10 MG (21) TBPK tablet; Take following package directions  Dispense: 21 tablet; Refill: 0  Suspect lingering affects from asthmatic bronchitis/upper airway cough syndrome. Supportive measures and OTC medications reviewed. Stop albuterol due to headache. Will attempt trial of Xopenex. Start Qvar at low dose. Will give additional short taper of prednisone until Qvar gets into her system. Follow-up with PCP.   Follow Up Instructions: I discussed the assessment and treatment plan with the patient. The patient was provided an opportunity to ask questions and all  were answered. The patient agreed with the plan and demonstrated an understanding of the instructions.  A copy of instructions were sent to the patient via MyChart unless otherwise noted below.   The patient was advised to call back or seek an in-person evaluation if the symptoms worsen or if the condition fails to improve as anticipated.    Piedad Climes, PA-C

## 2023-11-18 NOTE — Patient Instructions (Signed)
  Summer Thomas, thank you for joining Summer Climes, PA-C for today's virtual visit.  While this provider is not your primary care provider (PCP), if your PCP is located in our provider database this encounter information will be shared with them immediately following your visit.   A Summer Thomas account gives you access to today's visit and all your visits, tests, and labs performed at [facility] " click here if you don't have a Summer Thomas Thomas account or go to [email or web address]  Consent: (Patient) Summer Thomas [facility] provided verbal consent for this virtual visit at the beginning of the encounter.  Current Medications:  Current Outpatient Medications:    Summer Thomas 1/35 tablet, Take 1 tablet by mouth daily., Disp: , Rfl:    albuterol (VENTOLIN HFA) 108 (90 Base) MCG/ACT inhaler, Inhale 2 puffs into the lungs every 6 (six) hours as needed for wheezing or shortness of breath., Disp: 8 g, Rfl: 0   cetirizine (ZYRTEC) 10 MG tablet, Take 1 tablet (10 mg total) by mouth daily., Disp: 30 tablet, Rfl: 11   fluticasone (FLONASE) 50 MCG/ACT nasal spray, Place 2 sprays into both nostrils daily., Disp: 16 g, Rfl: 6   metFORMIN (GLUCOPHAGE-XR) 500 MG 24 hr tablet, Take 500 mg by mouth daily., Disp: , Rfl:    Medications ordered in this encounter:  No orders of the defined types were placed in this encounter.    *If you need refills on other medications prior to your next appointment, please contact your pharmacy*  Follow-Up: Call back or seek an in-person evaluation if the symptoms worsen or if the condition fails to improve as anticipated.  [facility] [phone]  Other Instructions Continue to hydrate and rest. Consider running a humidifier in your bedroom at night. Stop albuterol due to headache. Will attempt trial of Xopenex. Start Qvar at low dose. Will give additional short taper of prednisone until Qvar gets into your  system. Follow-up with PCP.   If you have been instructed to have an in-person evaluation today at a local Urgent Care facility, please use the link below. It will take you to a list of all of our available [facility], including address, phone number and hours of operation. Please do not delay care.  [facility]  If you or a family member do not have a primary care provider, use the link below to schedule a visit and establish care. When you choose a [facility] primary care physician or advanced practice provider, you gain a long-term partner in health. Find a Primary Care Provider  Learn more about [facility]'s in-office and virtual care options: [facility] Get Care Now

## 2023-12-17 ENCOUNTER — Other Ambulatory Visit: Payer: Self-pay | Admitting: Obstetrics and Gynecology

## 2023-12-17 DIAGNOSIS — Z01818 Encounter for other preprocedural examination: Secondary | ICD-10-CM

## 2024-01-10 ENCOUNTER — Ambulatory Visit: Payer: Managed Care, Other (non HMO)

## 2024-01-10 ENCOUNTER — Ambulatory Visit (INDEPENDENT_AMBULATORY_CARE_PROVIDER_SITE_OTHER): Payer: Managed Care, Other (non HMO) | Admitting: Family Medicine

## 2024-01-10 VITALS — BP 128/70 | HR 105 | Temp 98.3°F | Ht 67.0 in | Wt 265.4 lb

## 2024-01-10 DIAGNOSIS — J209 Acute bronchitis, unspecified: Secondary | ICD-10-CM | POA: Diagnosis not present

## 2024-01-10 DIAGNOSIS — R6889 Other general symptoms and signs: Secondary | ICD-10-CM | POA: Diagnosis not present

## 2024-01-10 DIAGNOSIS — J4540 Moderate persistent asthma, uncomplicated: Secondary | ICD-10-CM | POA: Diagnosis not present

## 2024-01-10 DIAGNOSIS — J45909 Unspecified asthma, uncomplicated: Secondary | ICD-10-CM | POA: Insufficient documentation

## 2024-01-10 LAB — POCT INFLUENZA A/B
Influenza A, POC: NEGATIVE
Influenza B, POC: NEGATIVE

## 2024-01-10 LAB — POC COVID19 BINAXNOW: SARS Coronavirus 2 Ag: NEGATIVE

## 2024-01-10 MED ORDER — BUDESONIDE-FORMOTEROL FUMARATE 160-4.5 MCG/ACT IN AERO: 2.0000 | INHALATION_SPRAY | Freq: Two times a day (BID) | RESPIRATORY_TRACT | 3 refills | Status: DC

## 2024-01-10 MED ORDER — BENZONATATE 100 MG PO CAPS
200.0000 mg | ORAL_CAPSULE | Freq: Three times a day (TID) | ORAL | 0 refills | Status: DC | PRN
Start: 2024-01-10 — End: 2024-04-02

## 2024-01-10 MED ORDER — AIRSUPRA 90-80 MCG/ACT IN AERO: 1.0000 | INHALATION_SPRAY | RESPIRATORY_TRACT | 11 refills | Status: DC | PRN

## 2024-01-10 MED ORDER — PREDNISONE 20 MG PO TABS
40.0000 mg | ORAL_TABLET | Freq: Every day | ORAL | 0 refills | Status: AC
Start: 2024-01-10 — End: 2024-01-15

## 2024-01-10 NOTE — Assessment & Plan Note (Deleted)
Fever, chills, cough, and wheezing for 5 days. Crackles heard on auscultation. History of COVID-19 infection with subsequent respiratory issues. Possible bronchial asthma. Levalbuterol not providing relief. -Order chest x-ray to rule out pneumonia. -Consider systemic steroids and inhalers for treatment. -Trial of Airsupra inhaler as rescue therapy, with monitoring for headache side effect. -Consider antibiotics if chest x-ray indicates need.

## 2024-01-10 NOTE — Patient Instructions (Addendum)
It was a pleasure meeting you today. Thank you for allowing me to take part in your health care.  Our goals for today as we discussed include:  Chest xray today  Start Symbicort 2 puffs two times a day Start Airsupra 1-2 puffs every 4 hours as needed for shortness of breath.  Max is 12 puffs in 24 hours  Start Prednisone 40 mg for 5 days Tessalon Perles as needed for cough  You can take Tylenol and/or Ibuprofen as needed for fever reduction and pain relief.   For cough: honey 1/2 to 1 teaspoon (you can dilute the honey in water or another fluid).  You can also use guaifenesin and dextromethorphan for cough. You can use a humidifier for chest congestion and cough.  If you don't have a humidifier, you can sit in the bathroom with the hot shower running.      For sore throat: try warm salt water gargles, cepacol lozenges, throat spray, warm tea or water with lemon/honey, popsicles or ice, or OTC cold relief medicine for throat discomfort.   For congestion: take a daily anti-histamine like Zyrtec, Claritin, and a oral decongestant, such as pseudoephedrine.  You can also use Flonase 1-2 sprays in each nostril daily.   It is important to stay hydrated: drink plenty of fluids (water, gatorade/powerade/pedialyte, juices, or teas) to keep your throat moisturized and help further relieve irritation/discomfort.      This is a list of the screening recommended for you and due dates:  Health Maintenance  Topic Date Due   Pneumococcal Vaccination (1 of 2 - PCV) Never done   Pap with HPV screening  03/14/2020   COVID-19 Vaccine (2 - 2024-25 season) 07/28/2023   Flu Shot  02/24/2024*   DTaP/Tdap/Td vaccine (2 - Td or Tdap) 08/29/2033   Hepatitis C Screening  Completed   HIV Screening  Completed   HPV Vaccine  Aged Out  *Topic was postponed. The date shown is not the original due date.    If you have any questions or concerns, please do not hesitate to call the office at [phone].  I  look forward to our next visit and until then take care and stay safe.  Regards,   Dana Allan, MD   [facility]

## 2024-01-10 NOTE — Progress Notes (Signed)
 SUBJECTIVE:   Chief Complaint  Patient presents with   Nasal Congestion   HPI Presents for acute visit  Discussed the use of AI scribe software for clinical note transcription with the patient, who gave verbal consent to proceed.  History of Present Illness   Summer Thomas is a 43 year old female with bronchial asthma who presents with fever, chills, cough, and wheezing.  She has been experiencing fever, chills, cough, and wheezing that began approximately five days ago. The fever and chills resolved by Wednesday, but wheezing persists, especially when lying down, accompanied by a 'crackle' sound when breathing. She coughs up significant mucus, particularly at night, disrupting her sleep. Over-the-counter cough syrup has been ineffective.  She uses Qvar and levalbuterol inhalers, with levalbuterol used four times daily. However, levalbuterol is no longer effective. She has a history of COVID-19 infection in November, after which she developed airway issues with colds, leading to a diagnosis of bronchial asthma and the prescription of an albuterol inhaler. She has used prednisone twice for COVID-related issues, with the last course ending in mid-January, which was effective in managing symptoms.  Her son had a viral illness last week, though not confirmed as RSV. No chest pain, difficulty breathing deeply, or smoking history. She has a cat and a dog and takes Flonase and Zyrtec for allergies, having received allergy shots in high school. She is also on metformin for an unspecified condition.      PERTINENT PMH / PSH: As above  OBJECTIVE:  BP 128/70   Pulse (!) 105   Temp 98.3 F (36.8 C)   Ht 5\' 7"  (1.702 m)   Wt 265 lb 6.4 oz (120.4 kg)   SpO2 99%   BMI 41.57 kg/m    Physical Exam Vitals reviewed.  Constitutional:      General: She is not in acute distress.    Appearance: Normal appearance. She is obese. She is not ill-appearing, toxic-appearing or diaphoretic.   Eyes:     General:        Right eye: No discharge.        Left eye: No discharge.     Conjunctiva/sclera: Conjunctivae normal.  Cardiovascular:     Rate and Rhythm: Regular rhythm. Tachycardia present.     Heart sounds: Normal heart sounds.  Pulmonary:     Effort: Pulmonary effort is normal.     Breath sounds: Rhonchi present.  Abdominal:     General: Bowel sounds are normal.  Musculoskeletal:        General: Normal range of motion.  Skin:    General: Skin is warm and dry.  Neurological:     General: No focal deficit present.     Mental Status: She is alert and oriented to person, place, and time. Mental status is at baseline.  Psychiatric:        Mood and Affect: Mood normal.        Behavior: Behavior normal.        Thought Content: Thought content normal.        Judgment: Judgment normal.           07/09/2023    8:40 AM 01/28/2023   12:13 PM 09/24/2022    3:21 PM 07/09/2022   11:47 AM  Depression screen PHQ 2/9  Decreased Interest 0 0 0 0  Down, Depressed, Hopeless 0 0 0 0  PHQ - 2 Score 0 0 0 0  Altered sleeping 0 0    Tired, decreased  energy 0 0    Change in appetite 0 0    Feeling bad or failure about yourself  0 0    Trouble concentrating 0 0    Moving slowly or fidgety/restless 0 0    Suicidal thoughts 0 0    PHQ-9 Score 0 0    Difficult doing work/chores Not difficult at all Not difficult at all        07/09/2023    8:40 AM 01/28/2023   12:13 PM  GAD 7 : Generalized Anxiety Score  Nervous, Anxious, on Edge 0 0  Control/stop worrying 0 0  Worry too much - different things 0 0  Trouble relaxing 0 0  Restless 0 0  Easily annoyed or irritable 0 0  Afraid - awful might happen 0 0  Total GAD 7 Score 0 0  Anxiety Difficulty Not difficult at all Not difficult at all    ASSESSMENT/PLAN:  Flu-like symptoms -     POC COVID-19 BinaxNow -     POCT Influenza A/B  Acute bronchitis, unspecified organism Assessment & Plan: Fever, chills, cough, and wheezing  for 5 days. Crackles heard on auscultation. History of COVID-19 infection with subsequent respiratory issues. Possible bronchial asthma. Levalbuterol not providing relief. -Order chest x-ray to rule out pneumonia. -Consider systemic steroids and inhalers for treatment. -Trial of Airsupra inhaler as rescue therapy, with monitoring for headache side effect. -Consider antibiotics if chest x-ray indicates need.  Orders: -     DG Chest 2 View -     predniSONE; Take 2 tablets (40 mg total) by mouth daily with breakfast for 5 days.  Dispense: 10 tablet; Refill: 0 -     Budesonide-Formoterol Fumarate; Inhale 2 puffs into the lungs 2 (two) times daily.  Dispense: 1 each; Refill: 3 -     Airsupra; Inhale 1-2 puffs into the lungs every 4 (four) hours as needed.  Dispense: 10.7 g; Refill: 11 -     Benzonatate; Take 2 capsules (200 mg total) by mouth 3 (three) times daily as needed for cough.  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  Dispense: 10.7 g; Refill: 11   PDMP reviewed  Return if symptoms worsen or fail to improve, for PCP.  Dana Allan, MD

## 2024-01-10 NOTE — Assessment & Plan Note (Signed)
Fever, chills, cough, and wheezing for 5 days. Crackles heard on auscultation. History of COVID-19 infection with subsequent respiratory issues. Possible bronchial asthma. Levalbuterol not providing relief. -Order chest x-ray to rule out pneumonia. -Consider systemic steroids and inhalers for treatment. -Trial of Airsupra inhaler as rescue therapy, with monitoring for headache side effect. -Consider antibiotics if chest x-ray indicates need.

## 2024-01-12 ENCOUNTER — Encounter: Payer: Self-pay | Admitting: Family Medicine

## 2024-01-19 ENCOUNTER — Encounter: Payer: Self-pay | Admitting: Family Medicine

## 2024-01-19 DIAGNOSIS — R6889 Other general symptoms and signs: Secondary | ICD-10-CM | POA: Insufficient documentation

## 2024-01-28 ENCOUNTER — Encounter: Payer: Self-pay | Admitting: Family Medicine

## 2024-01-28 ENCOUNTER — Telehealth: Admitting: Family Medicine

## 2024-01-28 DIAGNOSIS — J209 Acute bronchitis, unspecified: Secondary | ICD-10-CM | POA: Diagnosis not present

## 2024-01-28 MED ORDER — PROMETHAZINE-DM 6.25-15 MG/5ML PO SYRP: 5.0000 mL | ORAL_SOLUTION | Freq: Four times a day (QID) | ORAL | 0 refills | Status: AC | PRN

## 2024-01-28 MED ORDER — AZITHROMYCIN 250 MG PO TABS: ORAL_TABLET | ORAL | 0 refills | Status: AC

## 2024-01-28 NOTE — Patient Instructions (Signed)

## 2024-01-28 NOTE — Progress Notes (Signed)
 Virtual Visit Consent   [name], you are scheduled for a virtual visit with a [facility] provider today. Just as with appointments in the office, your consent must be obtained to participate. Your consent will be active for this visit and any virtual visit you may have with one of our providers in the next 365 days. If you have a MyChart account, a copy of this consent can be sent to you electronically.  As this is a virtual visit, video technology does not allow for your provider to perform a traditional examination. This may limit your provider's ability to fully assess your condition. If your provider identifies any concerns that need to be evaluated in person or the need to arrange testing (such as labs, EKG, etc.), we will make arrangements to do so. Although advances in technology are sophisticated, we cannot ensure that it will always work on either your end or our end. If the connection with a video visit is poor, the visit may have to be switched to a telephone visit. With either a video or telephone visit, we are not always able to ensure that we have a secure connection.  By engaging in this virtual visit, you consent to the provision of healthcare and authorize for your insurance to be billed (if applicable) for the services provided during this visit. Depending on your insurance coverage, you may receive a charge related to this service.  I need to obtain your verbal consent now. Are you willing to proceed with your visit today? Summer Thomas has provided verbal consent on 01/28/2024 for a virtual visit (video or telephone). Georgana Curio, FNP  Date: 01/28/2024 5:27 PM   Virtual Visit via Video Note   I, Georgana Curio, connected with  Summer Thomas  ([id number], 1981/10/12) on 01/28/24 at  5:00 PM EST by a video-enabled telemedicine application and verified that I am speaking with the correct person using two identifiers.  Location: Patient: Virtual Visit  Location Patient: Home Provider: Virtual Visit Location Provider: Home Office   I discussed the limitations of evaluation and management by telemedicine and the availability of in person appointments. The patient expressed understanding and agreed to proceed.    History of Present Illness: Summer Thomas is a 43 y.o. who identifies as a female who was assigned female at birth, and is being seen today for cough prod with green mucus. No abd pain. Treated with inhalers and prednisone a few weeks ago and sx persist and worsen. Marland Kitchen  HPI: HPI  Problems:  Patient Active Problem List   Diagnosis Date Noted   Flu-like symptoms 01/19/2024   Acute bronchitis 01/10/2024   Asthmatic bronchitis 01/10/2024   Abdominal bloating 07/22/2023   Weight gain 07/22/2023   Pharyngitis 10/07/2022   Plantar fasciitis of right foot 07/15/2022   Obesity, Class II, BMI 35-39.9 07/15/2022   Encounter for hepatitis C screening test for low risk patient 07/15/2022   Anemia 07/15/2022    Allergies:  Allergies  Allergen Reactions   Doxycycline    Penicillins Rash    Childhood rxn   Medications:  Current Outpatient Medications:    azithromycin (ZITHROMAX) 250 MG tablet, Take 2 tablets on day 1, then 1 tablet daily on days 2 through 5, Disp: 6 tablet, Rfl: 0   promethazine-dextromethorphan (PROMETHAZINE-DM) 6.25-15 MG/5ML syrup, Take 5 mLs by mouth 4 (four) times daily as needed for up to 10 days for cough., Disp: 118 mL, Rfl: 0   ALAYCEN 1/35 tablet, Take  1 tablet by mouth daily., Disp: , Rfl:    Albuterol-Budesonide (AIRSUPRA) 90-80 MCG/ACT AERO, Inhale 1-2 puffs into the lungs every 4 (four) hours as needed., Disp: 10.7 g, Rfl: 11   benzonatate (TESSALON PERLES) 100 MG capsule, Take 2 capsules (200 mg total) by mouth 3 (three) times daily as needed for cough., Disp: 20 capsule, Rfl: 0   budesonide-formoterol (SYMBICORT) 160-4.5 MCG/ACT inhaler, Inhale 2 puffs into the lungs 2 (two) times daily., Disp: 1  each, Rfl: 3   fluticasone (FLONASE) 50 MCG/ACT nasal spray, Place 2 sprays into both nostrils daily., Disp: 16 g, Rfl: 6   metFORMIN (GLUCOPHAGE-XR) 500 MG 24 hr tablet, Take 500 mg by mouth daily., Disp: , Rfl:   Observations/Objective: Patient is well-developed, well-nourished in no acute distress.  Resting comfortably  at home.  Head is normocephalic, atraumatic.  No labored breathing.  Speech is clear and coherent with logical content.  Patient is alert and oriented at baseline.    Assessment and Plan: 1. Acute bronchitis, unspecified organism (Primary)  Increase fluids humidifier at night, tylenol or ibuprofen , UC as needed.   Follow Up Instructions: I discussed the assessment and treatment plan with the patient. The patient was provided an opportunity to ask questions and all were answered. The patient agreed with the plan and demonstrated an understanding of the instructions.  A copy of instructions were sent to the patient via MyChart unless otherwise noted below.     The patient was advised to call back or seek an in-person evaluation if the symptoms worsen or if the condition fails to improve as anticipated.    Georgana Curio, FNP

## 2024-02-05 ENCOUNTER — Telehealth: Payer: Self-pay

## 2024-02-05 NOTE — Telephone Encounter (Signed)
 Left message to return call to our office.  Okay to give pt results. Please document when pt it spoke to.

## 2024-02-05 NOTE — Telephone Encounter (Signed)
-----   Message from Dana Allan sent at 02/05/2024  4:04 PM EDT ----- No pneumonia or infection seen on xray

## 2024-02-06 NOTE — Telephone Encounter (Signed)
 Left message to return call to our office.  Also sent Mychart message. Please see result note.

## 2024-02-06 NOTE — Telephone Encounter (Signed)
 Copied from CRM [id number]. Topic: Clinical - Lab/Test Results >> Feb 06, 2024  8:02 AM Alcus Dad wrote: Reason for CRM: Patient called in about lab results

## 2024-02-10 ENCOUNTER — Encounter (HOSPITAL_COMMUNITY): Payer: Self-pay | Admitting: Obstetrics and Gynecology

## 2024-02-11 ENCOUNTER — Encounter (HOSPITAL_COMMUNITY): Payer: Self-pay | Admitting: Obstetrics and Gynecology

## 2024-02-11 NOTE — Pre-Procedure Instructions (Signed)
 Surgical Instructions   Your procedure is scheduled on :  Wednesday,  02-19-2024. Report to [facility] Main Entrance "A" at 9:30 A.M., then check in with the Admitting office. Any questions or running late day of surgery: call [phone]  Questions prior to your surgery date: call [phone] or [phone], Monday-Friday, 8am-4pm. If you experience any cold or flu symptoms such as cough, fever, chills, shortness of breath, etc. between now and your scheduled surgery, please notify your surgeon office.    Remember:  Do not eat any food and do not drink any liquid after midnight the night before your surgery    Take these medicines the morning of surgery with A SIP OF WATER : Loratadine (claritin) Fluticasone (flonase) nasal spray Please bring your Symbicort inhaler with you day of surgery   May take these medicines IF NEEDED: none   One week prior to surgery, STOP taking any Aspirin (unless otherwise instructed by your surgeon) Aleve, Naproxen, Ibuprofen, Motrin, Advil, Goody's, BC's, all herbal medications, fish oil, and non-prescription vitamins.                     Do NOT Smoke (Tobacco/Vaping) for 24 hours prior to your procedure.  If you use a CPAP at night, you may bring your mask/headgear for your overnight stay.   You will be asked to remove any contacts, glasses, piercing's, hearing aid's, dentures/partials prior to surgery. Please bring cases for these items if needed.    Patients discharged the day of surgery will not be allowed to drive home, and someone needs to stay with them for 24 hours.  SURGICAL WAITING ROOM VISITATION Patients may have no more than 2 support people in the waiting area - these visitors may rotate.   Pre-op nurse will coordinate an appropriate time for 1 ADULT support person, who may not rotate, to accompany patient in pre-op.  Children under the age of 88 must have an adult with them who is not the patient and must remain in the main  waiting area with an adult.  If the patient needs to stay at the hospital during part of their recovery, the visitor guidelines for inpatient rooms apply.  Please refer to the [facility] website for the visitor guidelines for any additional information.   If you received a COVID test during your pre-op visit  it is requested that you wear a mask when out in public, stay away from anyone that may not be feeling well and notify your surgeon if you develop symptoms. If you have been in contact with anyone that has tested positive in the last 10 days please notify you surgeon.      Pre-operative CHG Bathing Instructions   You can play a key role in reducing the risk of infection after surgery. Your skin needs to be as free of germs as possible. You can reduce the number of germs on your skin by washing with CHG (chlorhexidine gluconate) soap before surgery. CHG is an antiseptic soap that kills germs and continues to kill germs even after washing.   DO NOT use if you have an allergy to chlorhexidine/CHG or antibacterial soaps. If your skin becomes reddened or irritated, stop using the CHG and notify Pre-Op nurse day of surgery.  If you have any skin irritation or problems with the surgical soap (CHG), do not use.  Please get a bar of gold dial soap or any antibacterial soap and shower following the instructions below.  TAKE A SHOWER THE NIGHT BEFORE SURGERY AND THE DAY OF SURGERY    Please keep in mind the following:  DO NOT shave, including legs and underarms, 48 hours prior to surgery.   You may shave your face before/day of surgery.  Place clean sheets on your bed the night before surgery Use a clean washcloth (not used since being washed) for each shower. DO NOT sleep with pet's night before surgery.  CHG Shower Instructions:  Wash your face and private area with normal soap. If you choose to wash your hair, wash first with your normal shampoo.  After you use shampoo/soap,  rinse your hair and body thoroughly to remove shampoo/soap residue.  Turn the water OFF and apply half the bottle of CHG soap to a CLEAN washcloth.  Apply CHG soap ONLY FROM YOUR NECK DOWN TO YOUR TOES (washing for 3-5 minutes)  DO NOT use CHG soap on face, private areas, open wounds, or sores.  Pay special attention to the area where your surgery is being performed.  If you are having back surgery, having someone wash your back for you may be helpful. Wait 2 minutes after CHG soap is applied, then you may rinse off the CHG soap.  Pat dry with a clean towel  Put on clean pajamas    Additional instructions for the day of surgery: DO NOT APPLY any lotions, oils, deodorants (may use underarm deodorant),  cologne/ perfumes or makeup Do not wear jewelry / piercing's/  metal/  permanent jewelry must be removed prior to arrival Do not wear nail polish, gel polish, artificial nails, or any other type of covering on natural nails (fingers and toes) Do not bring valuables to the hospital. [facility] is not responsible for valuables/personal belongings. Put on clean/comfortable clothes.  Please brush your teeth.  Ask your nurse before applying any prescription medications to the skin.

## 2024-02-11 NOTE — Progress Notes (Addendum)
 Spoke w/ via phone for pre-op interview--- pt Lab needs dos----   urine preg      Lab results------ lab appt 02-14-2024 getting CBC/ BMP (gent)/ T&S COVID test -----patient states asymptomatic no test needed Arrive at ------- 0930 on 02-19-2024 NPO after MN w/ exception sips of water w/ meds Pre-Surgery Ensure or G2: n/a  Med rec completed Medications to take morning of surgery ----- claritin, flonase spray Diabetic medication ----- n/a  GLP1 agonist last dose: n/a GLP1 instructions:  Patient instructed no nail polish to be worn day of surgery Patient instructed to bring photo id and insurance card day of surgery Patient aware to have Driver (ride ) / caregiver    for 24 hours after surgery -  husban, joseph Patient Special Instructions ----- will pick up bag w/ CHG and written instructions at lab appt Pre-Op special Instructions ----- n/a  Patient verbalized understanding of instructions that were given at this phone interview. Patient denies chest pain, sob, fever, cough at the interview.   Spoke w/ patient about recent URI/ acute bronchitis that started as diagnosis persistant moderate asthmatic bronchitis 01-10-2024 (pcp note in epic) with fever, cough, wheezing, crackles.  Patient given prednisone / inhalers.  Cxr done , no pneumonia.  Vitual visit 01-28-2024 in epic persistant cough , inhalers not helping, and completed prednisone.  Pt at first stated that was only given prednisone again that did not work then last week on the 02/06/2024 given Zpak antibiotic and symptom resolved today. At this point at had reviewed w/ anesthesia, Dr R. Fitzgerald MDA, stated patient should wait another week due to needing to be two weeks asymptomatic.  However, patient she looked at the calendar and stated she was wrong she was given Zpak and prednisone on 01-28-2024 completed Zpak on 02-01-2024 and symptoms resolved on 02-02-2024 , has not needed to use inhalers since then.   So counting this does  put patient right at two weeks asymptomatic.  Per anesthesia URI guidelines which I reviewed with patient.  Patient verbalized understanding that at the anesthesiologist's discretion if their symptoms have returned or interpretations of patients other minor symptoms linger.

## 2024-02-14 ENCOUNTER — Encounter (HOSPITAL_COMMUNITY)
Admission: RE | Admit: 2024-02-14 | Discharge: 2024-02-14 | Disposition: A | Discharge status: Home / Self Care | Source: Ambulatory Visit | Attending: Obstetrics and Gynecology | Admitting: Obstetrics and Gynecology

## 2024-02-14 DIAGNOSIS — Z01812 Encounter for preprocedural laboratory examination: Secondary | ICD-10-CM | POA: Diagnosis present

## 2024-02-14 DIAGNOSIS — Z01818 Encounter for other preprocedural examination: Secondary | ICD-10-CM

## 2024-02-14 LAB — CBC
HCT: 41.2 % (ref 36.0–46.0)
Hemoglobin: 13.7 g/dL (ref 12.0–15.0)
MCH: 30.4 pg (ref 26.0–34.0)
MCHC: 33.3 g/dL (ref 30.0–36.0)
MCV: 91.4 fL (ref 80.0–100.0)
Platelets: 405 10*3/uL — ABNORMAL HIGH (ref 150–400)
RBC: 4.51 MIL/uL (ref 3.87–5.11)
RDW: 13.7 % (ref 11.5–15.5)
WBC: 8.2 10*3/uL (ref 4.0–10.5)
nRBC: 0 % (ref 0.0–0.2)

## 2024-02-14 LAB — TYPE AND SCREEN
ABO/RH(D): A POS
Antibody Screen: NEGATIVE

## 2024-02-14 LAB — BASIC METABOLIC PANEL
Anion gap: 8 (ref 5–15)
BUN: 9 mg/dL (ref 6–20)
CO2: 24 mmol/L (ref 22–32)
Calcium: 8.6 mg/dL — ABNORMAL LOW (ref 8.9–10.3)
Chloride: 103 mmol/L (ref 98–111)
Creatinine, Ser: 0.54 mg/dL (ref 0.44–1.00)
GFR, Estimated: >60 mL/min (ref >60)
Glucose, Bld: 189 mg/dL — ABNORMAL HIGH (ref 70–99)
Potassium: 4 mmol/L (ref 3.5–5.1)
Sodium: 135 mmol/L (ref 135–145)

## 2024-02-17 NOTE — H&P (Signed)
 43 y.o. [id number] complains of menorrhagia.  Previously:"Pt. has had some heavy BTB; partner has had vasectomy, on continuous OCPs for menorrhagia, had several occasions of heavy bleeding but did 2/2/2 and it resolved; tried Novasure in 2023 but was unable to seat instrument; Mammo 07/19/2023; pap today/tb"  "Refilled OCPs- will stay on for now for menorrhagia. EMB done 2023, [place] last done 12-2024- Physician interpretation: 5x5x5, EM 1.26 cm. LO normal, no other abnormalities, no fibroids./mah.  Offered IUD but could not guarantee correcting periods, Pt wants to stay on OCPs for now. If continued heavy bleeding, consider hyst?"  Pt now desires permanent solution.   Past Medical History:  Diagnosis Date   Asthma    recent URI / asthmatic bronchitis dx 01-10-2024 by pcp (note in epic) given prednisone / inhaler's for fever/ cough/ wheezing/ negative covid&flu and cxr no pneumonia/   vitual visit 01-28-2024  persistant cough , inhaler not helpting,  given zpak/ prednisone, dx acute bronchitis ;    (02-11-2024  pt stated completed zpak 02-01-2024  symptoms resolved 02-02-2024 and no inhaler use since   History of gestational hypertension    Menorrhagia    unresponsive to conservative therapy   Pre-diabetes    Past Surgical History:  Procedure Laterality Date   CERVICAL CERCLAGE  04/04/2010   @[facility]  by dr Edward Jolly   CERVICAL CERCLAGE N/A 08/16/2014   Procedure: Trans Vaginal Cerclage, Vaginal Ultrasound, Modified Macdonald, Bladder Instillation Milk;  Surgeon: Loney Laurence, MD;  Location: [facility];  Service: Gynecology;  Laterality: N/A;   CERVICAL CERCLAGE N/A 02/10/2015   Procedure: CERCLAGE CERVICAL;  Surgeon: Carrington Clamp, MD;  Location: [facility];  Service: Obstetrics;  Laterality: N/A;   CESAREAN SECTION  09/25/2010   @[facility]  by dr Edward Jolly   CESAREAN SECTION N/A 02/10/2015   Procedure: CESAREAN SECTION , ;  Surgeon: Carrington Clamp, MD;  Location: [facility];  Service: Obstetrics;  Laterality:  N/A;   HYSTEROSCOPY WITH D & C  09/2022   by dr Henderson Cloud in office;   attempted endometrial ablation   WISDOM TOOTH EXTRACTION      Social History   Socioeconomic History   Marital status: Married    Spouse name: Not on file   Number of children: Not on file   Years of education: Not on file   Highest education level: Bachelor's degree (e.g., BA, AB, BS)  Occupational History   Not on file  Tobacco Use   Smoking status: Never   Smokeless tobacco: Never  Vaping Use   Vaping status: Never Used  Substance and Sexual Activity   Alcohol use: Not Currently   Drug use: Never   Sexual activity: Yes    Birth control/protection: Pill, Other-see comments    Comment: husband had vasectomy  Other Topics Concern   Not on file  Social History Narrative   Not on file   Social Drivers of Health   Financial Resource Strain: Low Risk  (07/08/2023)   Overall Financial Resource Strain (CARDIA)    Difficulty of Paying Living Expenses: Not very hard  Food Insecurity: No Food Insecurity (07/08/2023)   Hunger Vital Sign    Worried About Running Out of Food in the Last Year: Never true    Ran Out of Food in the Last Year: Never true  Transportation Needs: No Transportation Needs (07/08/2023)   PRAPARE - Administrator, Civil Service (Medical): No    Lack of Transportation (Non-Medical): No  Physical Activity: Insufficiently Active (07/08/2023)  Exercise Vital Sign    Days of Exercise per Week: 3 days    Minutes of Exercise per Session: 40 min  Stress: No Stress Concern Present (07/08/2023)   [employer] of Occupational Health - Occupational Stress Questionnaire    Feeling of Stress : Only a little  Social Connections: Socially Integrated (07/08/2023)   Social Connection and Isolation Panel [NHANES]    Frequency of Communication with Friends and Family: More than three times a week    Frequency of Social Gatherings with Friends and Family: Once a week    Attends Religious  Services: More than 4 times per year    Active Member of [employer] or Organizations: Yes    Attends Engineer, structural: More than 4 times per year    Marital Status: Married  Catering manager Violence: Not on file    No current facility-administered medications on file prior to encounter.   Current Outpatient Medications on File Prior to Encounter  Medication Sig Dispense Refill   ALAYCEN 1/35 tablet Take 1 tablet by mouth daily.     Cyanocobalamin ([employer]S B12 GUMMIES) 500 MCG CHEW Chew 2 each by mouth daily.     fluticasone (FLONASE) 50 MCG/ACT nasal spray Place 2 sprays into both nostrils daily. 16 g 6   ibuprofen (ADVIL) 200 MG tablet Take 400 mg by mouth every 6 (six) hours as needed for headache.     loratadine (CLARITIN) 10 MG tablet Take 10 mg by mouth daily.     metFORMIN (GLUCOPHAGE-XR) 500 MG 24 hr tablet Take 500 mg by mouth every evening.      Allergies  Allergen Reactions   Airsupra [Albuterol-Budesonide] Other (See Comments)    Per pt "made feel real shaky and anxious"   Doxycycline Other (See Comments)    Per pt "  enlarged my liver"   Penicillins Rash    Childhood rxn    Vitals:   02/11/24 1517  Weight: 114.8 kg  Height: 5\' 6"  (1.676 m)    Lungs: clear to ascultation Cor:  RRR Abdomen:  soft, nontender, nondistended. Ex:  no cords, erythema Pelvic:   Vulva: no masses, no atrophy, no lesions Vagina: no tenderness, no erythema, no abnormal vaginal discharge, no vesicle(s) or ulcers, no cystocele, no rectocele Cervix: grossly normal, no discharge, no cervical motion tenderness, sample taken for a Pap smear Uterus: normal size (7), normal shape, midline, no uterine prolapse, non-tender Bladder/Urethra: normal meatus, no urethral discharge, no urethral mass, bladder non distended, Urethra well supported Adnexa/Parametria: no parametrial tenderness, no parametrial mass, no adnexal tenderness, no ovarian mass   A:  For robotic TLH, salpingectomies.   P:  P: All risks, benefits and alternatives d/w patient and she desires to proceed.  Patient has undergone an ERAS protocol and will receive preop antibiotics and SCDs during the operation.   Pt to have extended recovery but will go home same day if eating, ambulating, voiding and pain control is good.  Loney Laurence

## 2024-02-19 ENCOUNTER — Ambulatory Visit (HOSPITAL_BASED_OUTPATIENT_CLINIC_OR_DEPARTMENT_OTHER)

## 2024-02-19 ENCOUNTER — Encounter (HOSPITAL_COMMUNITY): Payer: Self-pay | Admitting: Obstetrics and Gynecology

## 2024-02-19 ENCOUNTER — Other Ambulatory Visit: Payer: Self-pay

## 2024-02-19 ENCOUNTER — Encounter (HOSPITAL_COMMUNITY)
Admission: RE | Disposition: A | Discharge status: Home / Self Care | Payer: Self-pay | Source: Home / Self Care | Attending: Obstetrics and Gynecology

## 2024-02-19 ENCOUNTER — Ambulatory Visit (HOSPITAL_COMMUNITY)

## 2024-02-19 ENCOUNTER — Ambulatory Visit (HOSPITAL_COMMUNITY)
Admission: RE | Admit: 2024-02-19 | Discharge: 2024-02-19 | Disposition: A | Discharge status: Home / Self Care | Payer: 59 | Attending: Obstetrics and Gynecology | Admitting: Obstetrics and Gynecology

## 2024-02-19 DIAGNOSIS — Z793 Long term (current) use of hormonal contraceptives: Secondary | ICD-10-CM | POA: Diagnosis not present

## 2024-02-19 DIAGNOSIS — N920 Excessive and frequent menstruation with regular cycle: Secondary | ICD-10-CM | POA: Diagnosis present

## 2024-02-19 DIAGNOSIS — Z6841 Body Mass Index (BMI) 40.0 and over, adult: Secondary | ICD-10-CM

## 2024-02-19 DIAGNOSIS — Z9889 Other specified postprocedural states: Secondary | ICD-10-CM

## 2024-02-19 DIAGNOSIS — Z01818 Encounter for other preprocedural examination: Secondary | ICD-10-CM

## 2024-02-19 DIAGNOSIS — E119 Type 2 diabetes mellitus without complications: Secondary | ICD-10-CM

## 2024-02-19 HISTORY — DX: Excessive and frequent menstruation with regular cycle: N92.0

## 2024-02-19 HISTORY — DX: Prediabetes: R73.03

## 2024-02-19 HISTORY — PX: ROBOTIC ASSISTED LAPAROSCOPIC HYSTERECTOMY AND SALPINGECTOMY: SHX6379

## 2024-02-19 HISTORY — DX: Personal history of other complications of pregnancy, childbirth and the puerperium: Z87.59

## 2024-02-19 HISTORY — PX: CYSTOSCOPY: SHX5120

## 2024-02-19 HISTORY — DX: Moderate persistent asthma, uncomplicated: J45.40

## 2024-02-19 HISTORY — DX: Unspecified asthma, uncomplicated: J45.909

## 2024-02-19 LAB — GLUCOSE, CAPILLARY
Glucose-Capillary: 90 mg/dL (ref 70–99)
Glucose-Capillary: 116 mg/dL — ABNORMAL HIGH (ref 70–99)

## 2024-02-19 LAB — POCT PREGNANCY, URINE: Preg Test, Ur: NEGATIVE

## 2024-02-19 SURGERY — XI ROBOTIC ASSISTED LAPAROSCOPIC HYSTERECTOMY AND SALPINGECTOMY
Anesthesia: General | Site: Pelvis

## 2024-02-19 MED ORDER — HYDROMORPHONE HCL 1 MG/ML IJ SOLN
INTRAMUSCULAR | Status: AC
  Filled 2024-02-19: qty 1

## 2024-02-19 MED ORDER — ONDANSETRON HCL 4 MG/2ML IJ SOLN
INTRAMUSCULAR | Status: DC | PRN
  Administered 2024-02-19: 4 mg via INTRAVENOUS

## 2024-02-19 MED ORDER — LIDOCAINE 2% (20 MG/ML) 5 ML SYRINGE
INTRAMUSCULAR | Status: DC | PRN
  Administered 2024-02-19: 80 mg via INTRAVENOUS

## 2024-02-19 MED ORDER — SODIUM CHLORIDE 0.9 % IR SOLN
Status: DC | PRN
  Administered 2024-02-19: 1000 mL

## 2024-02-19 MED ORDER — SOD CITRATE-CITRIC ACID 500-334 MG/5ML PO SOLN: 30.0000 mL | ORAL | Status: DC

## 2024-02-19 MED ORDER — ROCURONIUM BROMIDE 10 MG/ML (PF) SYRINGE
PREFILLED_SYRINGE | INTRAVENOUS | Status: DC | PRN
  Administered 2024-02-19: 20 mg via INTRAVENOUS
  Administered 2024-02-19: 80 mg via INTRAVENOUS

## 2024-02-19 MED ORDER — CHLORHEXIDINE GLUCONATE 0.12 % MT SOLN
OROMUCOSAL | Status: AC
  Filled 2024-02-19: qty 15

## 2024-02-19 MED ORDER — METHOCARBAMOL 1000 MG/10ML IJ SOLN
INTRAMUSCULAR | Status: AC
  Filled 2024-02-19: qty 10

## 2024-02-19 MED ORDER — DEXMEDETOMIDINE HCL IN NACL 80 MCG/20ML IV SOLN
INTRAVENOUS | Status: DC | PRN
  Administered 2024-02-19: 12 ug via INTRAVENOUS

## 2024-02-19 MED ORDER — POVIDONE-IODINE 10 % EX SWAB
2.0000 | Freq: Once | CUTANEOUS | Status: AC
  Administered 2024-02-19: 2 via TOPICAL

## 2024-02-19 MED ORDER — FENTANYL CITRATE (PF) 250 MCG/5ML IJ SOLN
INTRAMUSCULAR | Status: DC | PRN
  Administered 2024-02-19: 150 ug via INTRAVENOUS
  Administered 2024-02-19: 50 ug via INTRAVENOUS

## 2024-02-19 MED ORDER — SIMETHICONE 80 MG PO CHEW: 80.0000 mg | CHEWABLE_TABLET | Freq: Four times a day (QID) | ORAL | Status: DC | PRN

## 2024-02-19 MED ORDER — BUPIVACAINE HCL 0.25 % IJ SOLN
INTRAMUSCULAR | Status: DC | PRN
  Administered 2024-02-19: 10 mL

## 2024-02-19 MED ORDER — CEFAZOLIN SODIUM-DEXTROSE 2-4 GM/100ML-% IV SOLN
INTRAVENOUS | Status: AC
  Filled 2024-02-19: qty 100

## 2024-02-19 MED ORDER — KETAMINE HCL 10 MG/ML IJ SOLN
INTRAMUSCULAR | Status: DC | PRN
  Administered 2024-02-19: 20 mg via INTRAVENOUS
  Administered 2024-02-19: 10 mg via INTRAVENOUS
  Administered 2024-02-19: 20 mg via INTRAVENOUS

## 2024-02-19 MED ORDER — MEPERIDINE HCL 25 MG/ML IJ SOLN: 6.2500 mg | INTRAMUSCULAR | Status: DC | PRN

## 2024-02-19 MED ORDER — KETOROLAC TROMETHAMINE 30 MG/ML IJ SOLN: 30.0000 mg | Freq: Once | INTRAMUSCULAR | Status: DC | PRN

## 2024-02-19 MED ORDER — PROPOFOL 10 MG/ML IV BOLUS
INTRAVENOUS | Status: AC
  Filled 2024-02-19: qty 20

## 2024-02-19 MED ORDER — KETAMINE HCL 50 MG/5ML IJ SOSY
PREFILLED_SYRINGE | INTRAMUSCULAR | Status: AC
  Filled 2024-02-19: qty 5

## 2024-02-19 MED ORDER — ACETAMINOPHEN 500 MG PO TABS: 1000.0000 mg | ORAL_TABLET | ORAL | Status: AC

## 2024-02-19 MED ORDER — ONDANSETRON HCL 4 MG/2ML IJ SOLN
INTRAMUSCULAR | Status: AC
  Filled 2024-02-19: qty 2

## 2024-02-19 MED ORDER — ONDANSETRON HCL 4 MG/2ML IJ SOLN: 4.0000 mg | Freq: Four times a day (QID) | INTRAMUSCULAR | Status: DC | PRN

## 2024-02-19 MED ORDER — FENTANYL CITRATE (PF) 250 MCG/5ML IJ SOLN
INTRAMUSCULAR | Status: AC
  Filled 2024-02-19: qty 5

## 2024-02-19 MED ORDER — SCOPOLAMINE 1 MG/3DAYS TD PT72
1.0000 | MEDICATED_PATCH | TRANSDERMAL | Status: DC
  Administered 2024-02-19: 1.5 mg via TRANSDERMAL
  Filled 2024-02-19 (×2): qty 1

## 2024-02-19 MED ORDER — ONDANSETRON HCL 4 MG PO TABS: 4.0000 mg | ORAL_TABLET | Freq: Four times a day (QID) | ORAL | Status: DC | PRN

## 2024-02-19 MED ORDER — DEXAMETHASONE SODIUM PHOSPHATE 10 MG/ML IJ SOLN
INTRAMUSCULAR | Status: DC | PRN
  Administered 2024-02-19: 10 mg via INTRAVENOUS

## 2024-02-19 MED ORDER — SODIUM CHLORIDE (PF) 0.9 % IJ SOLN
INTRAMUSCULAR | Status: AC
  Filled 2024-02-19: qty 50

## 2024-02-19 MED ORDER — MIDAZOLAM HCL 2 MG/2ML IJ SOLN
INTRAMUSCULAR | Status: AC
Start: 2024-02-19 — End: ?
  Filled 2024-02-19: qty 2

## 2024-02-19 MED ORDER — ROCURONIUM BROMIDE 10 MG/ML (PF) SYRINGE
PREFILLED_SYRINGE | INTRAVENOUS | Status: AC
  Filled 2024-02-19: qty 10

## 2024-02-19 MED ORDER — LACTATED RINGERS IV SOLN: INTRAVENOUS | Status: DC

## 2024-02-19 MED ORDER — GABAPENTIN 300 MG PO CAPS
300.0000 mg | ORAL_CAPSULE | ORAL | Status: AC
Start: 2024-02-19 — End: 2024-02-19
  Administered 2024-02-19: 300 mg via ORAL

## 2024-02-19 MED ORDER — GENTAMICIN SULFATE 40 MG/ML IJ SOLN
5.0000 mg/kg | INTRAVENOUS | Status: AC
  Administered 2024-02-19: 407.6 mg via INTRAVENOUS
  Filled 2024-02-19: qty 10.25

## 2024-02-19 MED ORDER — AMISULPRIDE (ANTIEMETIC) 5 MG/2ML IV SOLN: 10.0000 mg | Freq: Once | INTRAVENOUS | Status: DC | PRN

## 2024-02-19 MED ORDER — ONDANSETRON HCL 4 MG/2ML IJ SOLN: 4.0000 mg | Freq: Once | INTRAMUSCULAR | Status: DC | PRN

## 2024-02-19 MED ORDER — SODIUM CHLORIDE 0.9 % IV SOLN
INTRAVENOUS | Status: DC | PRN
  Administered 2024-02-19: 60 mL

## 2024-02-19 MED ORDER — CHLORHEXIDINE GLUCONATE 0.12 % MT SOLN
15.0000 mL | Freq: Once | OROMUCOSAL | Status: AC
  Administered 2024-02-19: 15 mL via OROMUCOSAL

## 2024-02-19 MED ORDER — SUGAMMADEX SODIUM 200 MG/2ML IV SOLN
INTRAVENOUS | Status: DC | PRN
  Administered 2024-02-19: 300 mg via INTRAVENOUS

## 2024-02-19 MED ORDER — BUPIVACAINE HCL (PF) 0.25 % IJ SOLN
INTRAMUSCULAR | Status: AC
  Filled 2024-02-19: qty 30

## 2024-02-19 MED ORDER — GABAPENTIN 300 MG PO CAPS
ORAL_CAPSULE | ORAL | Status: AC
  Filled 2024-02-19: qty 1

## 2024-02-19 MED ORDER — OXYCODONE HCL 5 MG PO TABS
5.0000 mg | ORAL_TABLET | ORAL | Status: DC | PRN
  Administered 2024-02-19 (×2): 5 mg via ORAL
  Filled 2024-02-19: qty 2

## 2024-02-19 MED ORDER — OXYCODONE HCL 5 MG PO TABS: 5.0000 mg | ORAL_TABLET | Freq: Once | ORAL | Status: DC | PRN

## 2024-02-19 MED ORDER — ORAL CARE MOUTH RINSE: 15.0000 mL | Freq: Once | OROMUCOSAL | Status: AC

## 2024-02-19 MED ORDER — PROPOFOL 10 MG/ML IV BOLUS
INTRAVENOUS | Status: DC | PRN
  Administered 2024-02-19: 200 mg via INTRAVENOUS

## 2024-02-19 MED ORDER — DEXAMETHASONE SODIUM PHOSPHATE 10 MG/ML IJ SOLN
INTRAMUSCULAR | Status: AC
  Filled 2024-02-19: qty 1

## 2024-02-19 MED ORDER — ACETAMINOPHEN 500 MG PO TABS: 1000.0000 mg | ORAL_TABLET | Freq: Once | ORAL | Status: AC

## 2024-02-19 MED ORDER — LIDOCAINE 2% (20 MG/ML) 5 ML SYRINGE
INTRAMUSCULAR | Status: AC
Start: 2024-02-19 — End: ?
  Filled 2024-02-19: qty 5

## 2024-02-19 MED ORDER — ACETAMINOPHEN 500 MG PO TABS
ORAL_TABLET | ORAL | Status: AC
  Administered 2024-02-19: 1000 mg via ORAL
  Filled 2024-02-19: qty 2

## 2024-02-19 MED ORDER — METRONIDAZOLE 500 MG/100ML IV SOLN
500.0000 mg | INTRAVENOUS | Status: AC
  Administered 2024-02-19: 500 mg via INTRAVENOUS

## 2024-02-19 MED ORDER — METRONIDAZOLE 500 MG/100ML IV SOLN
INTRAVENOUS | Status: AC
  Filled 2024-02-19: qty 100

## 2024-02-19 MED ORDER — STERILE WATER FOR IRRIGATION IR SOLN
Status: DC | PRN
  Administered 2024-02-19: 1000 mL

## 2024-02-19 MED ORDER — CELECOXIB 200 MG PO CAPS
400.0000 mg | ORAL_CAPSULE | ORAL | Status: AC
Start: 2024-02-19 — End: 2024-02-19

## 2024-02-19 MED ORDER — HYDROMORPHONE HCL 1 MG/ML IJ SOLN
0.2500 mg | INTRAMUSCULAR | Status: DC | PRN
  Administered 2024-02-19 (×3): 0.5 mg via INTRAVENOUS

## 2024-02-19 MED ORDER — IBUPROFEN 800 MG PO TABS: 800.0000 mg | ORAL_TABLET | Freq: Three times a day (TID) | ORAL | 0 refills | Status: AC | PRN

## 2024-02-19 MED ORDER — METHOCARBAMOL 1000 MG/10ML IJ SOLN
500.0000 mg | Freq: Once | INTRAMUSCULAR | Status: AC
  Administered 2024-02-19: 500 mg via INTRAVENOUS

## 2024-02-19 MED ORDER — IBUPROFEN 200 MG PO TABS: 800.0000 mg | ORAL_TABLET | Freq: Three times a day (TID) | ORAL | Status: DC

## 2024-02-19 MED ORDER — OXYCODONE HCL 5 MG/5ML PO SOLN: 5.0000 mg | Freq: Once | ORAL | Status: DC | PRN

## 2024-02-19 MED ORDER — MENTHOL 3 MG MT LOZG: 1.0000 | LOZENGE | OROMUCOSAL | Status: DC | PRN

## 2024-02-19 MED ORDER — ROPIVACAINE HCL 5 MG/ML IJ SOLN
INTRAMUSCULAR | Status: AC
  Filled 2024-02-19: qty 30

## 2024-02-19 MED ORDER — HEMOSTATIC AGENTS (NO CHARGE) OPTIME
TOPICAL | Status: DC | PRN
Start: 2024-02-19 — End: 2024-02-19
  Administered 2024-02-19: 1

## 2024-02-19 MED ORDER — CELECOXIB 200 MG PO CAPS
ORAL_CAPSULE | ORAL | Status: AC
  Administered 2024-02-19: 400 mg via ORAL
  Filled 2024-02-19: qty 2

## 2024-02-19 MED ORDER — MIDAZOLAM HCL 2 MG/2ML IJ SOLN
INTRAMUSCULAR | Status: DC | PRN
  Administered 2024-02-19: 2 mg via INTRAVENOUS

## 2024-02-19 SURGICAL SUPPLY — 55 items
APPLICATOR ARISTA FLEXITIP XL (MISCELLANEOUS) IMPLANT
BARRIER ADHS 3X4 INTERCEED (GAUZE/BANDAGES/DRESSINGS) IMPLANT
COVER BACK TABLE 60X90IN (DRAPES) ×3 IMPLANT
COVER TIP SHEARS 8 DVNC (MISCELLANEOUS) ×3 IMPLANT
DEFOGGER SCOPE WARMER CLEARIFY (MISCELLANEOUS) ×3 IMPLANT
DERMABOND ADVANCED .7 DNX12 (GAUZE/BANDAGES/DRESSINGS) ×3 IMPLANT
DRAPE ARM DVNC X/XI (DISPOSABLE) ×12 IMPLANT
DRAPE COLUMN DVNC XI (DISPOSABLE) ×3 IMPLANT
DRAPE SURG IRRIG POUCH 19X23 (DRAPES) ×3 IMPLANT
DRAPE UTILITY XL STRL (DRAPES) ×3 IMPLANT
DRIVER NDL MEGA SUTCUT DVNCXI (INSTRUMENTS) ×3 IMPLANT
DRIVER NDLE MEGA SUTCUT DVNCXI (INSTRUMENTS) ×2 IMPLANT
DURAPREP 26ML APPLICATOR (WOUND CARE) ×3 IMPLANT
ELECT REM PT RETURN 9FT ADLT (ELECTROSURGICAL) ×2 IMPLANT
ELECTRODE REM PT RTRN 9FT ADLT (ELECTROSURGICAL) ×3 IMPLANT
FORCEPS BPLR FENES DVNC XI (FORCEP) ×3 IMPLANT
FORCEPS PROGRASP DVNC XI (FORCEP) IMPLANT
FORCEPS TENACULUM DVNC XI (FORCEP) IMPLANT
GAUZE 4X4 16PLY ~~LOC~~+RFID DBL (SPONGE) IMPLANT
GLOVE BIO SURGEON STRL SZ7 (GLOVE) ×9 IMPLANT
GLOVE BIOGEL PI IND STRL 6 (GLOVE) IMPLANT
GLOVE BIOGEL PI IND STRL 7.0 (GLOVE) ×6 IMPLANT
GLOVE SURG SS PI 6.5 STRL IVOR (GLOVE) IMPLANT
GLOVE SURG SYN 6.5 ES PF (GLOVE) ×4 IMPLANT
GLOVE SURG SYN 6.5 PF PI (GLOVE) IMPLANT
GOWN STRL SURGICAL XL XLNG (GOWN DISPOSABLE) IMPLANT
HEMOSTAT ARISTA ABSORB 3G PWDR (HEMOSTASIS) IMPLANT
HIBICLENS CHG 4% 4OZ BTL (MISCELLANEOUS) ×3 IMPLANT
IRRIGATION STRYKERFLOW (MISCELLANEOUS) ×3 IMPLANT
IRRIGATOR STRYKERFLOW (MISCELLANEOUS) ×2 IMPLANT
IV NS 1000ML BAXH (IV SOLUTION) IMPLANT
KIT PINK PAD W/HEAD ARE REST (MISCELLANEOUS) ×2 IMPLANT
KIT PINK PAD W/HEAD ARM REST (MISCELLANEOUS) ×3 IMPLANT
LEGGING LITHOTOMY PAIR STRL (DRAPES) ×3 IMPLANT
MANIPULATOR ADVINCU DEL 2.5 PL (MISCELLANEOUS) IMPLANT
NDL INSUFFLATION 14GA 120MM (NEEDLE) ×3 IMPLANT
NEEDLE INSUFFLATION 14GA 120MM (NEEDLE) ×2 IMPLANT
OBTURATOR OPTICAL STND 8 DVNC (TROCAR) ×2 IMPLANT
OBTURATOR OPTICALSTD 8 DVNC (TROCAR) ×3 IMPLANT
PACK ROBOT WH (CUSTOM PROCEDURE TRAY) ×3 IMPLANT
PACK ROBOTIC GOWN (GOWN DISPOSABLE) ×3 IMPLANT
PAD OB MATERNITY 11 LF (PERSONAL CARE ITEMS) ×3 IMPLANT
POUCH LAPAROSCOPIC INSTRUMENT (MISCELLANEOUS) IMPLANT
SCISSORS MNPLR CVD DVNC XI (INSTRUMENTS) ×3 IMPLANT
SEAL UNIV 5-12 XI (MISCELLANEOUS) ×9 IMPLANT
SET IRRIG Y TYPE TUR BLADDER L (SET/KITS/TRAYS/PACK) ×3 IMPLANT
SET TRI-LUMEN FLTR TB AIRSEAL (TUBING) ×3 IMPLANT
SUT VIC AB 0 CT1 36 (SUTURE) ×3 IMPLANT
SUT VICRYL RAPIDE 3 0 (SUTURE) ×6 IMPLANT
SUT VLOC 180 0 9IN GS21 (SUTURE) ×3 IMPLANT
TOWEL GREEN STERILE (TOWEL DISPOSABLE) ×3 IMPLANT
TRAY FOLEY W/BAG SLVR 14FR (SET/KITS/TRAYS/PACK) ×3 IMPLANT
TROCAR PORT AIRSEAL 5X120 (TROCAR) ×3 IMPLANT
UNDERPAD 30X36 HEAVY ABSORB (UNDERPADS AND DIAPERS) ×3 IMPLANT
WATER STERILE IRR 1000ML POUR (IV SOLUTION) ×3 IMPLANT

## 2024-02-19 NOTE — Anesthesia Preprocedure Evaluation (Signed)
 Anesthesia Evaluation  Patient identified by MRN, date of birth, ID band Patient awake    Reviewed: Allergy & Precautions, H&P , NPO status , Patient's Chart, lab work & pertinent test results  Airway Mallampati: II  TM Distance: >3 FB Neck ROM: Full    Dental  (+) Teeth Intact, Dental Advisory Given   Pulmonary asthma (only uses albuterol when sick)    Pulmonary exam normal breath sounds clear to auscultation       Cardiovascular negative cardio ROS Normal cardiovascular exam Rhythm:Regular Rate:Normal     Neuro/Psych negative neurological ROS  negative psych ROS   GI/Hepatic negative GI ROS, Neg liver ROS,,,  Endo/Other  diabetes, Well Controlled, Type 2, Oral Hypoglycemic Agents  Class 3 obesityBMI 41  Renal/GU negative Renal ROS  negative genitourinary   Musculoskeletal negative musculoskeletal ROS (+)    Abdominal  (+) + obese  Peds negative pediatric ROS (+)  Hematology negative hematology ROS (+)   Anesthesia Other Findings   Reproductive/Obstetrics negative OB ROS                             Anesthesia Physical Anesthesia Plan  ASA: 3  Anesthesia Plan: General   Post-op Pain Management: Tylenol PO (pre-op)*, Toradol IV (intra-op)*, Precedex, Ketamine IV* and Dilaudid IV   Induction: Intravenous  PONV Risk Score and Plan: 4 or greater and Ondansetron, Dexamethasone, Midazolam, Treatment may vary due to age or medical condition and Scopolamine patch - Pre-op  Airway Management Planned: Oral ETT  Additional Equipment: None  Intra-op Plan:   Post-operative Plan: Extubation in OR  Informed Consent: I have reviewed the patients History and Physical, chart, labs and discussed the procedure including the risks, benefits and alternatives for the proposed anesthesia with the patient or authorized representative who has indicated his/her understanding and acceptance.      Dental advisory given  Plan Discussed with: CRNA  Anesthesia Plan Comments:        Anesthesia Quick Evaluation

## 2024-02-19 NOTE — Anesthesia Procedure Notes (Signed)
 Procedure Name: Intubation Date/Time: 02/19/2024 11:11 AM  Performed by: Oletta Buehring C, CRNAPre-anesthesia Checklist: Patient identified, Emergency Drugs available, Suction available and Patient being monitored Patient Re-evaluated:Patient Re-evaluated prior to induction Oxygen Delivery Method: Circle system utilized Preoxygenation: Pre-oxygenation with 100% oxygen Induction Type: IV induction Ventilation: Mask ventilation without difficulty Laryngoscope Size: Mac and 3 Grade View: Grade I Tube type: Oral Tube size: 7.0 mm Number of attempts: 1 Airway Equipment and Method: Stylet and Oral airway Placement Confirmation: ETT inserted through vocal cords under direct vision, positive ETCO2 and breath sounds checked- equal and bilateral Secured at: 21 cm Tube secured with: Tape Dental Injury: Teeth and Oropharynx as per pre-operative assessment

## 2024-02-19 NOTE — Discharge Summary (Signed)
 Physician Discharge Summary  Patient ID: Summer Thomas MRN: [id number] DOB/AGE: 04-18-1981 43 y.o.  Admit date: 02/19/2024 Discharge date: 02/19/2024  Admission Diagnoses: menorrhagia  Discharge Diagnoses: suspected endometriosis Active Problems:   * No active hospital problems. *   Discharged Condition: good  Hospital Course: Uncomplicated robotic TLH, salpingectomies    Consults: None  Significant Diagnostic Studies: none  Treatments: surgery: Uncomplicated robotic TLH, salpingectomies    Discharge Exam: Blood pressure (!) 151/84, pulse 84, temperature 97.8 F (36.6 C), resp. rate (!) 22, height 5\' 6"  (1.676 m), weight 115.2 kg, SpO2 97%.   Disposition: Discharge disposition: 01-Home or Self Care       Discharge Instructions     Call MD for:  temperature >100.4   Complete by: As directed    Diet - low sodium heart healthy   Complete by: As directed    Discharge instructions   Complete by: As directed    No driving on narcotics, no sexual activity for 2 weeks.   Increase activity slowly   Complete by: As directed    May shower / Bathe   Complete by: As directed    Shower, no bath for 2 weeks.   Remove dressing in 24 hours   Complete by: As directed    Sexual Activity Restrictions   Complete by: As directed    No sexual activity for 2 weeks.      Allergies as of 02/19/2024       Reactions   Airsupra [albuterol-budesonide] Other (See Comments)   Per pt "made feel real shaky and anxious"   Doxycycline Other (See Comments)   Per pt "  enlarged my liver"   Penicillins Rash   Childhood rxn        Medication List     STOP taking these medications    ALYACEN 1/35 tablet Generic drug: norethindrone-ethinyl estradiol 1/35       TAKE these medications    acetaminophen 325 MG tablet Commonly known as: TYLENOL Take 650 mg by mouth every 6 (six) hours as needed for mild pain (pain score 1-3) or moderate pain (pain score 4-6).   Airsupra  90-80 MCG/ACT Aero Generic drug: Albuterol-Budesonide Inhale 1-2 puffs into the lungs every 4 (four) hours as needed.   benzonatate 100 MG capsule Commonly known as: Tessalon Perles Take 2 capsules (200 mg total) by mouth 3 (three) times daily as needed for cough.   budesonide-formoterol 160-4.5 MCG/ACT inhaler Commonly known as: SYMBICORT Inhale 2 puffs into the lungs 2 (two) times daily. What changed:  when to take this reasons to take this   cholecalciferol 25 MCG (1000 UNIT) tablet Commonly known as: VITAMIN D3 Take 1,000 Units by mouth daily.   CVS B12 Gummies 500 MCG Chew Generic drug: Cyanocobalamin Chew 2 each by mouth daily.   fluticasone 50 MCG/ACT nasal spray Commonly known as: FLONASE Place 2 sprays into both nostrils daily.   ibuprofen 800 MG tablet Commonly known as: ADVIL Take 1 tablet (800 mg total) by mouth every 8 (eight) hours as needed. What changed:  medication strength how much to take when to take this reasons to take this   loratadine 10 MG tablet Commonly known as: CLARITIN Take 10 mg by mouth daily.   metFORMIN 500 MG 24 hr tablet Commonly known as: GLUCOPHAGE-XR Take 500 mg by mouth every evening.      Percocet sent from office  Follow-up Information     Carrington Clamp, MD Follow up in 2 week(s).  Specialty: Obstetrics and Gynecology Contact information: [address] [place] [address] [place] [address] [phone]                 Signed: Loney Laurence 02/19/2024, 1:04 PM

## 2024-02-19 NOTE — Progress Notes (Signed)
 There has been no change in the patients history, status or exam since the history and physical.  Vitals:   02/11/24 1517 02/19/24 0858  BP:  (!) 180/88  Pulse:  (!) 106  Resp:  18  Temp:  98.7 F (37.1 C)  TempSrc:  Oral  SpO2:  97%  Weight: 114.8 kg 115.2 kg  Height: 5\' 6"  (1.676 m) 5\' 6"  (1.676 m)    Results for orders placed or performed during the hospital encounter of 02/19/24 (from the past 72 hours)  Pregnancy, urine POC     Status: None   Collection Time: 02/19/24  8:44 AM  Result Value Ref Range   Preg Test, Ur NEGATIVE NEGATIVE    Comment:        THE SENSITIVITY OF THIS METHODOLOGY IS >24 mIU/mL   Glucose, capillary     Status: None   Collection Time: 02/19/24  9:02 AM  Result Value Ref Range   Glucose-Capillary 90 70 - 99 mg/dL    Comment: Glucose reference range applies only to samples taken after fasting for at least 8 hours.    Loney Laurence

## 2024-02-19 NOTE — Op Note (Signed)
 02/19/2024  12:53 PM  PATIENT:  [name]  43 y.o. female  PRE-OPERATIVE DIAGNOSIS:  menorrhagia  POST-OPERATIVE DIAGNOSIS:  menorrhagia  PROCEDURE:  Procedure(s): XI ROBOTIC ASSISTED LAPAROSCOPIC HYSTERECTOMY AND SALPINGECTOMY (N/A) CYSTOSCOPY (N/A)  SURGEON:  Surgeons and Role:    * Carrington Clamp, MD - Primary    * Willa Frater, MD - Assisting  ANESTHESIA:   general  EBL:  100 mL   DRAINS: Urinary Catheter (Foley)   LOCAL MEDICATIONS USED:  MARCAINE   and ropivicaine, arista  SPECIMEN:  Source of Specimen:  uterus, cervix and bilateral tubes  DISPOSITION OF SPECIMEN:  PATHOLOGY  COUNTS:  YES  TOURNIQUET:  * No tourniquets in log *  DICTATION: .Note written in EPIC  PLAN OF CARE: Admit for overnight observation  PATIENT DISPOSITION:  PACU - hemodynamically stable.   Delay start of Pharmacological VTE agent (>24hrs) due to surgical blood loss or risk of bleeding: not applicable  Complications:  None.  Findings:  7 weeks size uterus.  Ovaries were normal.  Filmy adhesions of bowel to uterus and cul de sac and L ovary.  Plaques of endometriosis both clear and dark seen on broad ligament and cul de sac. The ureters were identified during multiple points of the case and were always out of the field of dissection.  On cystoscopy, the bladder was intact and bilateral spill was seen from each ureteral oriface.    Medications:  Gent, flagyl.  Ropivicaine and marcaine.   Technique:   After adequate anesthesia was achieved the patient was positioned, prepped and draped in usual sterile fashion.  A speculum was placed in the vagina and the cervix dilated with pratt dilators.  The 2.5 cm Koh ring Advincula was assembled and placed in proper fashion.  The  Speculum was removed and the bladder catheterized with a foley.     Attention was turned to the abdomen where a 1 cm incision was made 1 cm above the umbilicus.  The veress needle was introduced without  aspiration of bowel contents or blood and the abdomen insufflated. The 8.5 mm Robotic trocar was placed and the other three trocar sites were marked out, all approximately 10 cm from each other and the camera.  Two 8.5 mm trocars were placed on either side of the camera port and a 5 mm assistant port was placed 3 cm above the line of the other trocars.  All trocars were inserted under direct visualization of the camera.  The patient was placed in trendelenburg and then the Robot docked.  The fenestrated bipolar were placed on arm 1 and the Hot shears on arm 3 and introduced under direct visualization of the camera.   I then broke scrub and sat down at the console.  The above findings were noted and the ureters identified well out of the field of dissection.  After the adhesions had been removed with cautery and blunt dissection, the right fallopian tube was isolated and cauterized with the bipolar.  The Utero-ovarian ligament was then divided with the bipolar cautery and shears.  The posterior broad ligament was then divided with the hot shears until the uterosacral ligament.  The Broad and cardinal ligaments were then cauterized against the cervix to the level of the Koh ring, securing the uterine artery.  Each pedicle was then incised with the shears.  The anterior leaf was then incised at the reflection of the vessico-uterine junction and the lateral bladder retracted inferiorly after the round ligament had  been divided with the bipolar forceps.  The left tube was cauterized with the bipolar and divided with the shears;  then the left utero-ovarian ligament divided with the bipolar forceps and the scissors.  The round ligament was divided as well and the posterior leaf of the broad ligament then divided with the hot shears. The broad and cardinal ligaments were then cauterized on the left in the same way.   At the level of the internal os, the uterine arteries were bilaterally cauterized with the bipolar.  The  ureters were identified well out of the field of dissection.     The bladder was then able to be retracted inferiorly and the vesico-uterine fascia was incised in the midline until the bladder was removed one cm below the Koh ring.  The hot shears then circumferentially incised the vagina at the level of the reflection on the [facility] ring.  Once the uterus and cervix were amputated, cautery was used to insure hemostasis of the cuff.  Once hemostasis was achieved, the scissors were changed to the mega suture cut needle driver and the cuff was closed with a running stitches of 0-vicryl V loc.  Cautery was used to ensure hemostasis of the left pedicles very superficially. The ureters were peristalsing bilaterally well and very lateral to the areas of operation.     The Robot was then undocked and I scrubbed back in.  The ureters had been identified retroperitoneally and a small amount of oozing was seen in this area.  Arista was applied bilaterally.  The needle was removed and Ropivicaine was introduced into the pelvis. The skin incisions were closed with subcuticular stitches of 3-0 vicryl Rapide and Dermabond.  All instruments were removed from the vagina and cystoscopy performed, revealing an intact bladder and vigourous spill of urine from each ureteral orifice.  The cystoscope was removed and the patient taken to the recovery room in stable condition.   Corran Lalone A

## 2024-02-19 NOTE — Anesthesia Postprocedure Evaluation (Signed)
 Anesthesia Post Note  Patient: Summer Thomas [facility]  Procedure(s) Performed: XI ROBOTIC ASSISTED LAPAROSCOPIC HYSTERECTOMY AND SALPINGECTOMY (Pelvis) CYSTOSCOPY (Bladder)     Patient location during evaluation: PACU Anesthesia Type: General Level of consciousness: awake and alert, oriented and patient cooperative Pain management: pain level controlled Vital Signs Assessment: post-procedure vital signs reviewed and stable Respiratory status: spontaneous breathing, nonlabored ventilation and respiratory function stable Cardiovascular status: blood pressure returned to baseline and stable Postop Assessment: no apparent nausea or vomiting Anesthetic complications: no   No notable events documented.  Last Vitals:  Vitals:   02/19/24 1330 02/19/24 1345  BP: (!) 141/83 137/88  Pulse: 80 80  Resp: 19 16  Temp:    SpO2: 94% 93%    Last Pain:  Vitals:   02/19/24 1345  TempSrc:   PainSc: 4                  Lannie Fields

## 2024-02-19 NOTE — TOC Transition Note (Signed)
 Transition of Care [facility]) - Discharge Note   Patient Details  Name: Summer Thomas MRN: [id number] Date of Birth: 1981-05-27  Transition of [facility]) CM/SW Contact:  Harriet Masson, RN Phone Number: 02/19/2024, 3:59 PM   Clinical Narrative:    Patient stable to discharge home.  No TOC needs at this time.    Final next level of care: Home/Self Care Barriers to Discharge: Barriers Resolved   Patient Goals and CMS Choice Patient states their goals for this hospitalization and ongoing recovery are:: return home          Discharge Placement        home               Discharge Plan and Services Additional resources added to the After Visit Summary for                                       Social Drivers of Health (SDOH) Interventions SDOH Screenings   Food Insecurity: No Food Insecurity (07/08/2023)  Housing: Low Risk  (07/08/2023)  Transportation Needs: No Transportation Needs (07/08/2023)  Alcohol Screen: Low Risk  (07/08/2023)  Depression (PHQ2-9): Low Risk  (07/09/2023)  Financial Resource Strain: Low Risk  (07/08/2023)  Physical Activity: Insufficiently Active (07/08/2023)  Social Connections: Socially Integrated (07/08/2023)  Stress: No Stress Concern Present (07/08/2023)  Tobacco Use: Low Risk  (02/19/2024)     Readmission Risk Interventions     No data to display

## 2024-02-19 NOTE — Transfer of Care (Signed)
 Immediate Anesthesia Transfer of Care Note  Patient: Monnie Gudgel [facility]  Procedure(s) Performed: XI ROBOTIC ASSISTED LAPAROSCOPIC HYSTERECTOMY AND SALPINGECTOMY (Pelvis) CYSTOSCOPY (Bladder)  Patient Location: PACU  Anesthesia Type:General  Level of Consciousness: awake, alert , and sedated  Airway & Oxygen Therapy: Patient Spontanous Breathing and Patient connected to face mask oxygen  Post-op Assessment: Report given to RN and Post -op Vital signs reviewed and stable  Post vital signs: Reviewed and stable  Last Vitals:  Vitals Value Taken Time  BP 151/84 02/19/24 1300  Temp 36.6 C 02/19/24 1256  Pulse 85 02/19/24 1301  Resp 16 02/19/24 1301  SpO2 96 % 02/19/24 1301  Vitals shown include unfiled device data.  Last Pain:  Vitals:   02/19/24 1300  TempSrc:   PainSc: 7       Patients Stated Pain Goal: 5 (02/19/24 0858)  Complications: No notable events documented.

## 2024-02-19 NOTE — Brief Op Note (Signed)
 02/19/2024  12:53 PM  PATIENT:  [name]  43 y.o. female  PRE-OPERATIVE DIAGNOSIS:  menorrhagia  POST-OPERATIVE DIAGNOSIS:  menorrhagia  PROCEDURE:  Procedure(s): XI ROBOTIC ASSISTED LAPAROSCOPIC HYSTERECTOMY AND SALPINGECTOMY (N/A) CYSTOSCOPY (N/A)  SURGEON:  Surgeons and Role:    * Carrington Clamp, MD - Primary    * Willa Frater, MD - Assisting  ANESTHESIA:   general  EBL:  100 mL   DRAINS: Urinary Catheter (Foley)   LOCAL MEDICATIONS USED:  MARCAINE   and ropivicaine, arista  SPECIMEN:  Source of Specimen:  uterus, cervix and bilateral tubes  DISPOSITION OF SPECIMEN:  PATHOLOGY  COUNTS:  YES  TOURNIQUET:  * No tourniquets in log *  DICTATION: .Note written in EPIC  PLAN OF CARE: Admit for overnight observation  PATIENT DISPOSITION:  PACU - hemodynamically stable.   Delay start of Pharmacological VTE agent (>24hrs) due to surgical blood loss or risk of bleeding: not applicable

## 2024-02-20 ENCOUNTER — Encounter (HOSPITAL_COMMUNITY): Payer: Self-pay | Admitting: Obstetrics and Gynecology

## 2024-02-21 LAB — SURGICAL PATHOLOGY

## 2024-04-02 ENCOUNTER — Encounter: Payer: Self-pay | Admitting: Nurse Practitioner

## 2024-04-02 ENCOUNTER — Ambulatory Visit: Admitting: Nurse Practitioner

## 2024-04-02 VITALS — BP 126/78 | HR 101 | Temp 97.9°F | Ht 66.0 in | Wt 268.0 lb

## 2024-04-02 DIAGNOSIS — R519 Headache, unspecified: Secondary | ICD-10-CM

## 2024-04-02 DIAGNOSIS — J029 Acute pharyngitis, unspecified: Secondary | ICD-10-CM

## 2024-04-02 LAB — POCT RAPID STREP A (OFFICE): Rapid Strep A Screen: NEGATIVE

## 2024-04-02 NOTE — Progress Notes (Signed)
 Established Patient Office Visit  Subjective:  Patient ID: Summer Thomas, female    DOB: [birth year]  Age: 43 y.o. MRN: [id number]  CC:  Chief Complaint  Patient presents with   Acute Visit    Sore throat & headaches   Discussed the use of a AI scribe software for clinical note transcription with the patient, who gave verbal consent to proceed.  HPI  Summer Thomas is a 43 year old female who presents with a painful sore throat and headache.  She has had a painful sore throat since yesterday, with significant pain on swallowing localized to the left side. She had tactile fever yesterday. She also has a headache and occasional dry cough, with no drainage, congestion, or ear pain. She has a history of multiple positive strep tests and is allergic to penicillin and doxycycline. She has taken Advil  for pain management.  Past Medical History:  Diagnosis Date   Asthma    recent URI / asthmatic bronchitis dx 01-10-2024 by pcp (note in epic) given prednisone  / inhaler's for fever/ cough/ wheezing/ negative covid&flu and cxr no pneumonia/   vitual visit 01-28-2024  persistant cough , inhaler not helpting,  given zpak/ prednisone , dx acute bronchitis ;    (02-11-2024  pt stated completed zpak 02-01-2024  symptoms resolved 02-02-2024 and no inhaler use since   History of gestational hypertension    Menorrhagia    unresponsive to conservative therapy   Pre-diabetes     Past Surgical History:  Procedure Laterality Date   CERVICAL CERCLAGE  04/04/2010   @[facility]  by dr Colvin Dec   CERVICAL CERCLAGE N/A 08/16/2014   Procedure: Trans Vaginal Cerclage, Vaginal Ultrasound, Modified Macdonald, Bladder Instillation Milk;  Surgeon: Oddis Bench, MD;  Location: [facility] ORS;  Service: Gynecology;  Laterality: N/A;   CERVICAL CERCLAGE N/A 02/10/2015   Procedure: CERCLAGE CERVICAL;  Surgeon: Matt Song, MD;  Location: [facility] ORS;  Service: Obstetrics;  Laterality: N/A;   CESAREAN SECTION   09/25/2010   @[facility]  by dr Colvin Dec   CESAREAN SECTION N/A 02/10/2015   Procedure: CESAREAN SECTION , ;  Surgeon: Matt Song, MD;  Location: [facility] ORS;  Service: Obstetrics;  Laterality: N/A;   CYSTOSCOPY N/A 02/19/2024   Procedure: CYSTOSCOPY;  Surgeon: Matt Song, MD;  Location: [facility] OR;  Service: Gynecology;  Laterality: N/A;   HYSTEROSCOPY WITH D & C  09/2022   by dr Leslee Rase in office;   attempted endometrial ablation   ROBOTIC ASSISTED LAPAROSCOPIC HYSTERECTOMY AND SALPINGECTOMY N/A 02/19/2024   Procedure: XI ROBOTIC ASSISTED LAPAROSCOPIC HYSTERECTOMY AND SALPINGECTOMY;  Surgeon: Matt Song, MD;  Location: [facility] OR;  Service: Gynecology;  Laterality: N/A;   WISDOM TOOTH EXTRACTION      Family History  Problem Relation Age of Onset   Hypertension Mother    Hearing loss Mother    Depression Mother    Arthritis Mother    Hypertension Father    Diabetes Father    Depression Father    Cancer Father    Arthritis Father    Hearing loss Sister    Depression Sister    Depression Brother     Social History   Socioeconomic History   Marital status: Married    Spouse name: Not on file   Number of children: Not on file   Years of education: Not on file   Highest education level: Bachelor's degree (e.g., BA, AB, BS)  Occupational History   Not on file  Tobacco Use   Smoking status:  Never   Smokeless tobacco: Never  Vaping Use   Vaping status: Never Used  Substance and Sexual Activity   Alcohol use: Not Currently   Drug use: Never   Sexual activity: Yes    Birth control/protection: Pill, Other-see comments    Comment: husband had vasectomy  Other Topics Concern   Not on file  Social History Narrative   Not on file   Social Drivers of Health   Financial Resource Strain: Low Risk  (07/08/2023)   Overall Financial Resource Strain (CARDIA)    Difficulty of Paying Living Expenses: Not very hard  Food Insecurity: No Food Insecurity (02/19/2024)   Hunger Vital Sign     Worried About Running Out of Food in the Last Year: Never true    Ran Out of Food in the Last Year: Never true  Transportation Needs: No Transportation Needs (02/19/2024)   PRAPARE - Administrator, Civil Service (Medical): No    Lack of Transportation (Non-Medical): No  Physical Activity: Insufficiently Active (07/08/2023)   Exercise Vital Sign    Days of Exercise per Week: 3 days    Minutes of Exercise per Session: 40 min  Stress: No Stress Concern Present (07/08/2023)   [employer] of Occupational Health - Occupational Stress Questionnaire    Feeling of Stress : Only a little  Social Connections: Socially Integrated (07/08/2023)   Social Connection and Isolation Panel [NHANES]    Frequency of Communication with Friends and Family: More than three times a week    Frequency of Social Gatherings with Friends and Family: Once a week    Attends Religious Services: More than 4 times per year    Active Member of [employer] or Organizations: Yes    Attends Engineer, structural: More than 4 times per year    Marital Status: Married  Catering manager Violence: Not At Risk (02/19/2024)   Humiliation, Afraid, Rape, and Kick questionnaire    Fear of Current or Ex-Partner: No    Emotionally Abused: No    Physically Abused: No    Sexually Abused: No     Outpatient Medications Prior to Visit  Medication Sig Dispense Refill   acetaminophen  (TYLENOL ) 325 MG tablet Take 650 mg by mouth every 6 (six) hours as needed for mild pain (pain score 1-3) or moderate pain (pain score 4-6).     cholecalciferol (VITAMIN D3) 25 MCG (1000 UNIT) tablet Take 1,000 Units by mouth daily.     Cyanocobalamin  ([employer] B12 GUMMIES) 500 MCG CHEW Chew 2 each by mouth daily.     fluticasone  (FLONASE ) 50 MCG/ACT nasal spray Place 2 sprays into both nostrils daily. 16 g 6   ibuprofen  (ADVIL ) 800 MG tablet Take 1 tablet (800 mg total) by mouth every 8 (eight) hours as needed. 30 tablet 0   loratadine (CLARITIN)  10 MG tablet Take 10 mg by mouth daily.     metFORMIN (GLUCOPHAGE-XR) 500 MG 24 hr tablet Take 500 mg by mouth every evening.     Albuterol -Budesonide  (AIRSUPRA ) 90-80 MCG/ACT AERO Inhale 1-2 puffs into the lungs every 4 (four) hours as needed. (Patient not taking: Reported on 02/11/2024) 10.7 g 11   benzonatate  (TESSALON  PERLES) 100 MG capsule Take 2 capsules (200 mg total) by mouth 3 (three) times daily as needed for cough. (Patient not taking: Reported on 02/12/2024) 20 capsule 0   budesonide -formoterol  (SYMBICORT ) 160-4.5 MCG/ACT inhaler Inhale 2 puffs into the lungs 2 (two) times daily. (Patient taking differently: Inhale 2 puffs  into the lungs 2 (two) times daily as needed (shortness of breath).) 1 each 3   No facility-administered medications prior to visit.    Allergies  Allergen Reactions   Airsupra  [Albuterol -Budesonide ] Other (See Comments)    Per pt "made feel real shaky and anxious"   Doxycycline Other (See Comments)    Per pt "  enlarged my liver"   Penicillins Rash    Childhood rxn    ROS Review of Systems  HENT:  Positive for trouble swallowing. Negative for ear pain.   Respiratory:  Positive for cough.   Neurological:  Positive for headaches.   Negative unless indicated in HPI.    Objective:     Physical Exam Constitutional:      Appearance: Normal appearance.  HENT:     Right Ear: Tympanic membrane normal.     Left Ear: Tympanic membrane normal.     Nose: No congestion.     Mouth/Throat:     Pharynx: Oropharynx is clear. Posterior oropharyngeal erythema present. No oropharyngeal exudate.  Cardiovascular:     Rate and Rhythm: Normal rate and regular rhythm.     Pulses: Normal pulses.     Heart sounds: Normal heart sounds.  Abdominal:     Tenderness: There is no left CVA tenderness.  Musculoskeletal:     Cervical back: Normal range of motion.  Neurological:     General: No focal deficit present.     Mental Status: She is alert. Mental status is at  baseline.  Psychiatric:        Mood and Affect: Mood normal.        Behavior: Behavior normal.        Thought Content: Thought content normal.        Judgment: Judgment normal.     BP 126/78   Pulse (!) 101   Temp 97.9 F (36.6 C)   Ht 5\' 6"  (1.676 m)   Wt 268 lb (121.6 kg)   SpO2 97%   BMI 43.26 kg/m  Wt Readings from Last 3 Encounters:  04/02/24 268 lb (121.6 kg)  02/19/24 254 lb (115.2 kg)  01/10/24 265 lb 6.4 oz (120.4 kg)     Health Maintenance  Topic Date Due   Pneumococcal Vaccine 42-20 Years old (1 of 2 - PCV) Never done   Cervical Cancer Screening (HPV/Pap Cotest)  03/14/2018   COVID-19 Vaccine (2 - 2024-25 season) 04/18/2024 (Originally 07/28/2023)   INFLUENZA VACCINE  06/26/2024   DTaP/Tdap/Td (2 - Td or Tdap) 08/29/2033   Hepatitis C Screening  Completed   HIV Screening  Completed   HPV VACCINES  Aged Out   Meningococcal B Vaccine  Aged Out    There are no preventive care reminders to display for this patient.  Lab Results  Component Value Date   TSH 0.44 07/09/2023   Lab Results  Component Value Date   WBC 8.2 02/14/2024   HGB 13.7 02/14/2024   HCT 41.2 02/14/2024   MCV 91.4 02/14/2024   PLT 405 (H) 02/14/2024   Lab Results  Component Value Date   NA 135 02/14/2024   K 4.0 02/14/2024   CO2 24 02/14/2024   GLUCOSE 189 (H) 02/14/2024   BUN 9 02/14/2024   CREATININE 0.54 02/14/2024   BILITOT 0.5 07/09/2023   ALKPHOS 50 07/09/2023   AST 15 07/09/2023   ALT 13 07/09/2023   PROT 7.2 07/09/2023   ALBUMIN 4.0 07/09/2023   CALCIUM 8.6 (L) 02/14/2024   ANIONGAP 8 02/14/2024  GFR 112.45 07/09/2023   Lab Results  Component Value Date   CHOL 161 07/09/2023   Lab Results  Component Value Date   HDL 57.20 07/09/2023   Lab Results  Component Value Date   LDLCALC 66 07/09/2023   Lab Results  Component Value Date   TRIG 190.0 (H) 07/09/2023   Lab Results  Component Value Date   CHOLHDL 3 07/09/2023   Lab Results  Component Value  Date   HGBA1C 5.7 07/09/2023      Assessment & Plan:  Pharyngitis, unspecified etiology Assessment & Plan: Acute sore throat with odynophagia, likely viral pharyngitis due to negative strep test. - Advise salt water  gargles. - Recommend lozenges. - Suggest numbing spray for throat pain. - Instruct to return if symptoms do not improve   Sorethroat -     POCT rapid strep A  Nonintractable headache, unspecified chronicity pattern, unspecified headache type Assessment & Plan: Slight frontal headache. - Advised to increase fluid in take and take Tylenol  as needed HD. -Return precautions discussed.      Follow-up: No follow-ups on file.   Dulcie Gammon, NP

## 2024-04-18 DIAGNOSIS — R519 Headache, unspecified: Secondary | ICD-10-CM | POA: Insufficient documentation

## 2024-04-18 NOTE — Assessment & Plan Note (Signed)
 Slight frontal headache. - Advised to increase fluid in take and take Tylenol  as needed HD. -Return precautions discussed.

## 2024-04-18 NOTE — Assessment & Plan Note (Signed)
 Acute sore throat with odynophagia, likely viral pharyngitis due to negative strep test. - Advise salt water  gargles. - Recommend lozenges. - Suggest numbing spray for throat pain. - Instruct to return if symptoms do not improve

## 2024-09-28 ENCOUNTER — Telehealth: Payer: Self-pay | Admitting: Emergency Medicine

## 2024-09-28 DIAGNOSIS — B9689 Other specified bacterial agents as the cause of diseases classified elsewhere: Secondary | ICD-10-CM

## 2024-09-28 DIAGNOSIS — J019 Acute sinusitis, unspecified: Secondary | ICD-10-CM

## 2024-09-28 MED ORDER — CEFUROXIME AXETIL 500 MG PO TABS: 500.0000 mg | ORAL_TABLET | Freq: Two times a day (BID) | ORAL | 0 refills | Status: AC

## 2024-09-28 MED ORDER — CLINDAMYCIN HCL 300 MG PO CAPS: 300.0000 mg | ORAL_CAPSULE | Freq: Three times a day (TID) | ORAL | 0 refills | Status: AC

## 2024-09-28 NOTE — Progress Notes (Signed)
 Virtual Visit Consent   Summer Thomas [facility], you are scheduled for a virtual visit with a [facility] provider today. Just as with appointments in the office, your consent must be obtained to participate. Your consent will be active for this visit and any virtual visit you may have with one of our providers in the next 365 days. If you have a MyChart account, a copy of this consent can be sent to you electronically.  As this is a virtual visit, video technology does not allow for your provider to perform a traditional examination. This may limit your provider's ability to fully assess your condition. If your provider identifies any concerns that need to be evaluated in person or the need to arrange testing (such as labs, EKG, etc.), we will make arrangements to do so. Although advances in technology are sophisticated, we cannot ensure that it will always work on either your end or our end. If the connection with a video visit is poor, the visit may have to be switched to a telephone visit. With either a video or telephone visit, we are not always able to ensure that we have a secure connection.  By engaging in this virtual visit, you consent to the provision of healthcare and authorize for your insurance to be billed (if applicable) for the services provided during this visit. Depending on your insurance coverage, you may receive a charge related to this service.  I need to obtain your verbal consent now. Are you willing to proceed with your visit today? [name] has provided verbal consent on 09/28/2024 for a virtual visit (video or telephone). [name], NP  Date: 09/28/2024 12:02 PM   Virtual Visit via Video Note   I, [name], connected with  Summer Thomas  ([id number], 26-Jan-1981) on 09/28/24 at 12:00 PM EST by a video-enabled telemedicine application and verified that I am speaking with the correct person using two identifiers.  Location: Patient: Virtual  Visit Location Patient: Home Provider: Virtual Visit Location Provider: Home Office   I discussed the limitations of evaluation and management by telemedicine and the availability of in person appointments. The patient expressed understanding and agreed to proceed.    History of Present Illness: Summer Thomas is a 43 y.o. who identifies as a female who was assigned female at birth, and is being seen today for sinus infection.   Head cold/sinus issues for more than a week. Dizzy, headache, stuffy nose, sore throat, post nasal drainage, coughing up green stuff. Some nasal drainage, but mostly down back of throat. Frontal sinus pressure B. No SOB/wheezing. No fever or chills.   Nighttime is the worst. Taking sudafed during day and mucinex. Saline spray once a day. Nyquil didn't help much.    HPI: HPI  Problems:  Patient Active Problem List   Diagnosis Date Noted   Nonintractable headache 04/18/2024   Postoperative state 02/19/2024   Flu-like symptoms 01/19/2024   Acute bronchitis 01/10/2024   Asthmatic bronchitis 01/10/2024   Abdominal bloating 07/22/2023   Weight gain 07/22/2023   Pharyngitis 10/07/2022   Plantar fasciitis of right foot 07/15/2022   Obesity, Class II, BMI 35-39.9 07/15/2022   Encounter for hepatitis C screening test for low risk patient 07/15/2022   Anemia 07/15/2022    Allergies:  Allergies  Allergen Reactions   Airsupra  [Albuterol -Budesonide ] Other (See Comments)    Per pt made feel real shaky and anxious   Doxycycline Other (See Comments)    Per pt  enlarged my liver   Penicillins Rash    Childhood rxn   Medications:  Current Outpatient Medications:    cefUROXime (CEFTIN) 500 MG tablet, Take 1 tablet (500 mg total) by mouth 2 (two) times daily with a meal for 7 days., Disp: 14 tablet, Rfl: 0   clindamycin  (CLEOCIN ) 300 MG capsule, Take 1 capsule (300 mg total) by mouth 3 (three) times daily for 7 days., Disp: 21 capsule, Rfl: 0    acetaminophen  (TYLENOL ) 325 MG tablet, Take 650 mg by mouth every 6 (six) hours as needed for mild pain (pain score 1-3) or moderate pain (pain score 4-6)., Disp: , Rfl:    cholecalciferol (VITAMIN D3) 25 MCG (1000 UNIT) tablet, Take 1,000 Units by mouth daily., Disp: , Rfl:    Cyanocobalamin  ([employer] B12 GUMMIES) 500 MCG CHEW, Chew 2 each by mouth daily., Disp: , Rfl:    fluticasone  (FLONASE ) 50 MCG/ACT nasal spray, Place 2 sprays into both nostrils daily., Disp: 16 g, Rfl: 6   ibuprofen  (ADVIL ) 800 MG tablet, Take 1 tablet (800 mg total) by mouth every 8 (eight) hours as needed., Disp: 30 tablet, Rfl: 0   loratadine (CLARITIN) 10 MG tablet, Take 10 mg by mouth daily., Disp: , Rfl:    metFORMIN (GLUCOPHAGE-XR) 500 MG 24 hr tablet, Take 500 mg by mouth every evening., Disp: , Rfl:   Observations/Objective: Patient is well-developed, well-nourished in no acute distress.  Resting comfortably  at home.  Head is normocephalic, atraumatic.  No labored breathing.  Speech is clear and coherent with logical content.  Patient is alert and oriented at baseline.    Assessment and Plan: 1. Acute bacterial sinusitis (Primary) - clindamycin  (CLEOCIN ) 300 MG capsule; Take 1 capsule (300 mg total) by mouth 3 (three) times daily for 7 days.  Dispense: 21 capsule; Refill: 0 - cefUROXime (CEFTIN) 500 MG tablet; Take 1 tablet (500 mg total) by mouth 2 (two) times daily with a meal for 7 days.  Dispense: 14 tablet; Refill: 0  Pt is pcn and doxycycline allergic. Next option is clindamycin  with 3rd gen cephalosporin  Follow Up Instructions: I discussed the assessment and treatment plan with the patient. The patient was provided an opportunity to ask questions and all were answered. The patient agreed with the plan and demonstrated an understanding of the instructions.  A copy of instructions were sent to the patient via MyChart unless otherwise noted below.   The patient was advised to call back or seek an in-person  evaluation if the symptoms worsen or if the condition fails to improve as anticipated.    [name], NP

## 2024-09-28 NOTE — Patient Instructions (Signed)
 Devere Anette Mose, thank you for joining [name], NP for today's virtual visit.  While this provider is not your primary care provider (PCP), if your PCP is located in our provider database this encounter information will be shared with them immediately following your visit.   A [facility] MyChart account gives you access to today's visit and all your visits, tests, and labs performed at [facility]  click here if you don't have a [facility] MyChart account or go to [email or web address]  Consent: (Patient) Summer Thomas [facility] provided verbal consent for this virtual visit at the beginning of the encounter.  Current Medications:  Current Outpatient Medications:    cefUROXime (CEFTIN) 500 MG tablet, Take 1 tablet (500 mg total) by mouth 2 (two) times daily with a meal for 7 days., Disp: 14 tablet, Rfl: 0   clindamycin  (CLEOCIN ) 300 MG capsule, Take 1 capsule (300 mg total) by mouth 3 (three) times daily for 7 days., Disp: 21 capsule, Rfl: 0   acetaminophen  (TYLENOL ) 325 MG tablet, Take 650 mg by mouth every 6 (six) hours as needed for mild pain (pain score 1-3) or moderate pain (pain score 4-6)., Disp: , Rfl:    cholecalciferol (VITAMIN D3) 25 MCG (1000 UNIT) tablet, Take 1,000 Units by mouth daily., Disp: , Rfl:    Cyanocobalamin  ([employer] B12 GUMMIES) 500 MCG CHEW, Chew 2 each by mouth daily., Disp: , Rfl:    fluticasone  (FLONASE ) 50 MCG/ACT nasal spray, Place 2 sprays into both nostrils daily., Disp: 16 g, Rfl: 6   ibuprofen  (ADVIL ) 800 MG tablet, Take 1 tablet (800 mg total) by mouth every 8 (eight) hours as needed., Disp: 30 tablet, Rfl: 0   loratadine (CLARITIN) 10 MG tablet, Take 10 mg by mouth daily., Disp: , Rfl:    metFORMIN (GLUCOPHAGE-XR) 500 MG 24 hr tablet, Take 500 mg by mouth every evening., Disp: , Rfl:    Medications ordered in this encounter:  Meds ordered this encounter  Medications   clindamycin  (CLEOCIN ) 300 MG capsule    Sig: Take 1 capsule  (300 mg total) by mouth 3 (three) times daily for 7 days.    Dispense:  21 capsule    Refill:  0   cefUROXime (CEFTIN) 500 MG tablet    Sig: Take 1 tablet (500 mg total) by mouth 2 (two) times daily with a meal for 7 days.    Dispense:  14 tablet    Refill:  0     *If you need refills on other medications prior to your next appointment, please contact your pharmacy*  Follow-Up: Call back or seek an in-person evaluation if the symptoms worsen or if the condition fails to improve as anticipated.  [facility] [phone]  Other Instructions Use your nasal saline spray several times a day and continue mucinex to help your congestion thin and drain.    If you have been instructed to have an in-person evaluation today at a local Urgent Care facility, please use the link below. It will take you to a list of all of our available [facility], including address, phone number and hours of operation. Please do not delay care.  [facility]  If you or a family member do not have a primary care provider, use the link below to schedule a visit and establish care. When you choose a [facility] primary care physician or advanced practice provider, you gain a long-term partner in health. Find a  Primary Care Provider  Learn more about [facility]'s in-office and virtual care options:  - Get Care Now

## 2024-12-02 ENCOUNTER — Encounter

## 2025-01-28 ENCOUNTER — Encounter: Payer: Self-pay | Admitting: Family
